# Patient Record
Sex: Male | Born: 1965 | Race: White | Hispanic: No | Marital: Married | Smoking: Never smoker
Health system: Southern US, Community
[De-identification: ages and names within clinical notes are randomized; demographics above are authoritative.]

## PROBLEM LIST (undated history)

## (undated) DIAGNOSIS — T8859XA Other complications of anesthesia, initial encounter: Secondary | ICD-10-CM

## (undated) DIAGNOSIS — G4730 Sleep apnea, unspecified: Secondary | ICD-10-CM

## (undated) DIAGNOSIS — I10 Essential (primary) hypertension: Secondary | ICD-10-CM

## (undated) DIAGNOSIS — J45909 Unspecified asthma, uncomplicated: Secondary | ICD-10-CM

## (undated) DIAGNOSIS — G709 Myoneural disorder, unspecified: Secondary | ICD-10-CM

## (undated) DIAGNOSIS — R7303 Prediabetes: Secondary | ICD-10-CM

## (undated) DIAGNOSIS — R42 Dizziness and giddiness: Secondary | ICD-10-CM

## (undated) DIAGNOSIS — C801 Malignant (primary) neoplasm, unspecified: Secondary | ICD-10-CM

## (undated) DIAGNOSIS — M1990 Unspecified osteoarthritis, unspecified site: Secondary | ICD-10-CM

## (undated) DIAGNOSIS — T7840XA Allergy, unspecified, initial encounter: Secondary | ICD-10-CM

## (undated) HISTORY — DX: Unspecified osteoarthritis, unspecified site: M19.90

## (undated) HISTORY — DX: Unspecified asthma, uncomplicated: J45.909

## (undated) HISTORY — DX: Allergy, unspecified, initial encounter: T78.40XA

## (undated) HISTORY — PX: NECK SURGERY: SHX720

## (undated) HISTORY — PX: COLONOSCOPY W/ POLYPECTOMY: SHX1380

## (undated) HISTORY — PX: SPINE SURGERY: SHX786

---

## 1998-07-01 HISTORY — PX: NECK SURGERY: SHX720

## 2002-10-16 ENCOUNTER — Encounter: Payer: Self-pay | Admitting: Emergency Medicine

## 2002-10-16 ENCOUNTER — Emergency Department (HOSPITAL_COMMUNITY): Admission: EM | Admit: 2002-10-16 | Discharge: 2002-10-16 | Payer: Self-pay | Admitting: Emergency Medicine

## 2014-01-17 ENCOUNTER — Ambulatory Visit (INDEPENDENT_AMBULATORY_CARE_PROVIDER_SITE_OTHER): Payer: BC Managed Care – PPO | Admitting: Family Medicine

## 2014-01-17 VITALS — BP 112/70 | HR 66 | Temp 98.6°F | Resp 16 | Ht 66.0 in | Wt 195.2 lb

## 2014-01-17 DIAGNOSIS — H9201 Otalgia, right ear: Secondary | ICD-10-CM

## 2014-01-17 DIAGNOSIS — H9209 Otalgia, unspecified ear: Secondary | ICD-10-CM

## 2014-01-17 DIAGNOSIS — H60399 Other infective otitis externa, unspecified ear: Secondary | ICD-10-CM

## 2014-01-17 DIAGNOSIS — R5383 Other fatigue: Secondary | ICD-10-CM

## 2014-01-17 DIAGNOSIS — H60391 Other infective otitis externa, right ear: Secondary | ICD-10-CM

## 2014-01-17 DIAGNOSIS — J309 Allergic rhinitis, unspecified: Secondary | ICD-10-CM

## 2014-01-17 DIAGNOSIS — R5381 Other malaise: Secondary | ICD-10-CM

## 2014-01-17 LAB — COMPREHENSIVE METABOLIC PANEL
ALBUMIN: 4.4 g/dL (ref 3.5–5.2)
ALK PHOS: 41 U/L (ref 39–117)
ALT: 14 U/L (ref 0–53)
AST: 15 U/L (ref 0–37)
BUN: 21 mg/dL (ref 6–23)
CALCIUM: 9.1 mg/dL (ref 8.4–10.5)
CHLORIDE: 102 meq/L (ref 96–112)
CO2: 28 mEq/L (ref 19–32)
CREATININE: 0.92 mg/dL (ref 0.50–1.35)
Glucose, Bld: 86 mg/dL (ref 70–99)
POTASSIUM: 4.1 meq/L (ref 3.5–5.3)
Sodium: 137 mEq/L (ref 135–145)
Total Bilirubin: 0.7 mg/dL (ref 0.2–1.2)
Total Protein: 6.9 g/dL (ref 6.0–8.3)

## 2014-01-17 LAB — CBC WITH DIFFERENTIAL/PLATELET
BASOS ABS: 0 10*3/uL (ref 0.0–0.1)
BASOS PCT: 0 % (ref 0–1)
Eosinophils Absolute: 0.2 10*3/uL (ref 0.0–0.7)
Eosinophils Relative: 2 % (ref 0–5)
HCT: 44.5 % (ref 39.0–52.0)
Hemoglobin: 15.4 g/dL (ref 13.0–17.0)
LYMPHS PCT: 25 % (ref 12–46)
Lymphs Abs: 1.9 10*3/uL (ref 0.7–4.0)
MCH: 30.9 pg (ref 26.0–34.0)
MCHC: 34.6 g/dL (ref 30.0–36.0)
MCV: 89.2 fL (ref 78.0–100.0)
MONO ABS: 0.8 10*3/uL (ref 0.1–1.0)
Monocytes Relative: 10 % (ref 3–12)
NEUTROS ABS: 4.9 10*3/uL (ref 1.7–7.7)
Neutrophils Relative %: 63 % (ref 43–77)
PLATELETS: 204 10*3/uL (ref 150–400)
RBC: 4.99 MIL/uL (ref 4.22–5.81)
RDW: 13.3 % (ref 11.5–15.5)
WBC: 7.7 10*3/uL (ref 4.0–10.5)

## 2014-01-17 MED ORDER — FLUTICASONE PROPIONATE 50 MCG/ACT NA SUSP: 2.0000 | Freq: Every day | NASAL | Status: DC

## 2014-01-17 MED ORDER — OFLOXACIN 0.3 % OT SOLN: 10.0000 [drp] | Freq: Every day | OTIC | Status: DC

## 2014-01-17 NOTE — Patient Instructions (Addendum)
Try to use pseudoephedrine twice a day for 3 days for ear/sinus pressure (morning and early afternoon)  Ibuprofen (advil) 2 tablets twice a day for pain    Otitis Externa Otitis externa is a bacterial or fungal infection of the outer ear canal. This is the area from the eardrum to the outside of the ear. Otitis externa is sometimes called "swimmer's ear." CAUSES  Possible causes of infection include:  Swimming in dirty water.  Moisture remaining in the ear after swimming or bathing.  Mild injury (trauma) to the ear.  Objects stuck in the ear (foreign body).  Cuts or scrapes (abrasions) on the outside of the ear. SYMPTOMS  The first symptom of infection is often itching in the ear canal. Later signs and symptoms may include swelling and redness of the ear canal, ear pain, and yellowish-white fluid (pus) coming from the ear. The ear pain may be worse when pulling on the earlobe. DIAGNOSIS  Your caregiver will perform a physical exam. A sample of fluid may be taken from the ear and examined for bacteria or fungi. TREATMENT  Antibiotic ear drops are often given for 10 to 14 days. Treatment may also include pain medicine or corticosteroids to reduce itching and swelling. PREVENTION   Keep your ear dry. Use the corner of a towel to absorb water out of the ear canal after swimming or bathing.  Avoid scratching or putting objects inside your ear. This can damage the ear canal or remove the protective wax that lines the canal. This makes it easier for bacteria and fungi to grow.  Avoid swimming in lakes, polluted water, or poorly chlorinated pools.  You may use ear drops made of rubbing alcohol and vinegar after swimming. Combine equal parts of white vinegar and alcohol in a bottle. Put 3 or 4 drops into each ear after swimming. HOME CARE INSTRUCTIONS   Apply antibiotic ear drops to the ear canal as prescribed by your caregiver.  Only take over-the-counter or prescription medicines for  pain, discomfort, or fever as directed by your caregiver.  If you have diabetes, follow any additional treatment instructions from your caregiver.  Keep all follow-up appointments as directed by your caregiver. SEEK MEDICAL CARE IF:   You have a fever.  Your ear is still red, swollen, painful, or draining pus after 3 days.  Your redness, swelling, or pain gets worse.  You have a severe headache.  You have redness, swelling, pain, or tenderness in the area behind your ear. MAKE SURE YOU:   Understand these instructions.  Will watch your condition.  Will get help right away if you are not doing well or get worse. Document Released: 06/17/2005 Document Revised: 09/09/2011 Document Reviewed: 07/04/2011 [facility] Patient Information 2015 [place], [place]. This information is not intended to replace advice given to you by your health care provider. Make sure you discuss any questions you have with your health care provider. Allergic Rhinitis Allergic rhinitis is when the mucous membranes in the nose respond to allergens. Allergens are particles in the air that cause your body to have an allergic reaction. This causes you to release allergic antibodies. Through a chain of events, these eventually cause you to release histamine into the blood stream. Although meant to protect the body, it is this release of histamine that causes your discomfort, such as frequent sneezing, congestion, and an itchy, runny nose.  CAUSES  Seasonal allergic rhinitis (hay fever) is caused by pollen allergens that may come from grasses, trees, and weeds.  Year-round allergic rhinitis (perennial allergic rhinitis) is caused by allergens such as house dust mites, pet dander, and mold spores.  SYMPTOMS   Nasal stuffiness (congestion).  Itchy, runny nose with sneezing and tearing of the eyes. DIAGNOSIS  Your health care provider can help you determine the allergen or allergens that trigger your symptoms. If you and your  health care provider are unable to determine the allergen, skin or blood testing may be used. TREATMENT  Allergic rhinitis does not have a cure, but it can be controlled by:  Medicines and allergy shots (immunotherapy).  Avoiding the allergen. Hay fever may often be treated with antihistamines in pill or nasal spray forms. Antihistamines block the effects of histamine. There are over-the-counter medicines that may help with nasal congestion and swelling around the eyes. Check with your health care provider before taking or giving this medicine.  If avoiding the allergen or the medicine prescribed do not work, there are many new medicines your health care provider can prescribe. Stronger medicine may be used if initial measures are ineffective. Desensitizing injections can be used if medicine and avoidance does not work. Desensitization is when a patient is given ongoing shots until the body becomes less sensitive to the allergen. Make sure you follow up with your health care provider if problems continue. HOME CARE INSTRUCTIONS It is not possible to completely avoid allergens, but you can reduce your symptoms by taking steps to limit your exposure to them. It helps to know exactly what you are allergic to so that you can avoid your specific triggers. SEEK MEDICAL CARE IF:   You have a fever.  You develop a cough that does not stop easily (persistent).  You have shortness of breath.  You start wheezing.  Symptoms interfere with normal daily activities. Document Released: 03/12/2001 Document Revised: 06/22/2013 Document Reviewed: 02/22/2013 [facility] Patient Information 2015 [place]. This information is not intended to replace advice given to you by your health care provider. Make sure you discuss any questions you have with your health care provider.

## 2014-01-17 NOTE — Progress Notes (Signed)
   Subjective:    Patient ID: Dale Singh, male    DOB: 1966-04-11, 48 y.o.   MRN: [id number]  HPI Patient presents today with complaints of right ear pain. Noticed fluid/pain about 6 weeks ago. Cleared out his ears with warm water and syringe and got out some cerumen. In last week has noted intermittent sharp pain in right ear, pain in right gums and pain top of right head. Has noticed this pain occasionally in last 4-5 months. Took ibuprofen 400 mg this morning about 2.5 hours ago and has improvement of pain.  Went to dentist 6 days ago and had a crown. Thinks some of his discomfort is probably related to having his mouth open for the crown.  Has daily runny nose, has tried multiple antihistamine/decongestents over the years. Can tolerate for awhile, then becomes too dry and has epistaxis.   Patient does not seek regular care, considers himself healthy and tries to take care of himself. Does not like to take medicine.  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 Constitutional: He is oriented to person, place, and time. He appears well-developed and well-nourished.  HENT:  Head: Normocephalic and atraumatic.  Right Ear: There is swelling and tenderness. A middle ear effusion is present.  Left Ear: Tympanic membrane, external ear and ear canal normal.  Nose: Mucosal edema and rhinorrhea present. Right sinus exhibits no maxillary sinus tenderness and no frontal sinus tenderness. Left sinus exhibits no maxillary sinus tenderness and no frontal sinus tenderness.  Mouth/Throat: Uvula is midline and mucous membranes are normal.  Posterior oropharyngeal erythema present. No oropharyngeal exudate or posterior oropharyngeal edema.  Eyes: Conjunctivae are normal.  Neck: Normal range of motion. Neck supple.  Cardiovascular: Normal rate and regular rhythm.   Pulmonary/Chest: Effort normal and breath sounds normal.  Musculoskeletal: Normal range of motion.  Lymphadenopathy:    He has no cervical adenopathy.  Neurological: He is alert and oriented to person, place, and time.  Skin: Skin is warm and dry.  Psychiatric: He has a normal mood and affect. His behavior is normal. Judgment and thought content normal.      Assessment & Plan:  1. Otalgia, right  2. Other fatigue - CBC with Differential - Comprehensive metabolic panel  3. Allergic rhinitis, unspecified allergic rhinitis type - fluticasone (FLONASE) 50 MCG/ACT nasal spray; Place 2 sprays into both nostrils daily.  Dispense: 16 g; Refill: 6  4. Otitis, externa, infective, right - ofloxacin (FLOXIN) 0.3 % otic solution; Place 10 drops into the right ear daily. For 7 days  Dispense: 10 mL; Refill: 0  -provided written and verbal instructions regarding otitis externa and allergic rhinitis -RTC if no improvement in 3-4 days or if worsening. -Encouraged patient to establish care and have CPE  Emi Belfasteborah B. Draysen Weygandt, FNP-BC  [facility]  01/17/2014 10:30 AM

## 2016-11-26 ENCOUNTER — Ambulatory Visit (INDEPENDENT_AMBULATORY_CARE_PROVIDER_SITE_OTHER): Payer: BC Managed Care – PPO | Admitting: Family Medicine

## 2016-11-26 ENCOUNTER — Encounter: Payer: Self-pay | Admitting: Family Medicine

## 2016-11-26 VITALS — BP 154/88 | HR 85 | Temp 98.0°F | Resp 16 | Ht >= 80 in | Wt 202.2 lb

## 2016-11-26 DIAGNOSIS — N410 Acute prostatitis: Secondary | ICD-10-CM

## 2016-11-26 DIAGNOSIS — R350 Frequency of micturition: Secondary | ICD-10-CM | POA: Diagnosis not present

## 2016-11-26 DIAGNOSIS — M545 Low back pain, unspecified: Secondary | ICD-10-CM

## 2016-11-26 DIAGNOSIS — R300 Dysuria: Secondary | ICD-10-CM | POA: Diagnosis not present

## 2016-11-26 DIAGNOSIS — R351 Nocturia: Secondary | ICD-10-CM

## 2016-11-26 DIAGNOSIS — R102 Pelvic and perineal pain: Secondary | ICD-10-CM | POA: Diagnosis not present

## 2016-11-26 LAB — POCT URINALYSIS DIP (MANUAL ENTRY)
BILIRUBIN UA: NEGATIVE mg/dL
Bilirubin, UA: NEGATIVE
Blood, UA: NEGATIVE
Glucose, UA: NEGATIVE mg/dL
LEUKOCYTES UA: NEGATIVE
NITRITE UA: NEGATIVE
PH UA: 5 (ref 5.0–8.0)
PROTEIN UA: NEGATIVE mg/dL
Spec Grav, UA: 1.01 (ref 1.010–1.025)
Urobilinogen, UA: 0.2 E.U./dL

## 2016-11-26 LAB — POCT CBC
GRANULOCYTE PERCENT: 75.7 % (ref 37–80)
HEMATOCRIT: 44 % (ref 43.5–53.7)
Hemoglobin: 15.9 g/dL (ref 14.1–18.1)
Lymph, poc: 3.5 — AB (ref 0.6–3.4)
MCH, POC: 32.5 pg — AB (ref 27–31.2)
MCHC: 36.1 g/dL — AB (ref 31.8–35.4)
MCV: 90 fL (ref 80–97)
MID (cbc): 0.5 (ref 0–0.9)
MPV: 8.8 fL (ref 0–99.8)
POC GRANULOCYTE: 12.3 — AB (ref 2–6.9)
POC LYMPH PERCENT: 21.5 %L (ref 10–50)
POC MID %: 2.8 %M (ref 0–12)
Platelet Count, POC: 231 10*3/uL (ref 142–424)
RBC: 4.88 M/uL (ref 4.69–6.13)
RDW, POC: 13.2 %
WBC: 16.2 10*3/uL — AB (ref 4.6–10.2)

## 2016-11-26 LAB — GLUCOSE, POCT (MANUAL RESULT ENTRY): POC Glucose: 89 mg/dl (ref 70–99)

## 2016-11-26 LAB — POC MICROSCOPIC URINALYSIS (UMFC): MUCUS RE: ABSENT

## 2016-11-26 MED ORDER — CIPROFLOXACIN HCL 500 MG PO TABS: 500.0000 mg | ORAL_TABLET | Freq: Two times a day (BID) | ORAL | 0 refills | Status: DC

## 2016-11-26 MED ORDER — CYCLOBENZAPRINE HCL 5 MG PO TABS: ORAL_TABLET | ORAL | 0 refills | Status: DC

## 2016-11-26 NOTE — Patient Instructions (Addendum)
Urinary symptoms may be due to prostatitis or infection of the prostate, as your infection fighting cells were elevated here today. Start Cipro 1 pill twice per day, I will let you know the results of your PSA within the next few days. If your symptoms are not improving in the next 3 days, prostate test is normal, or worsening symptoms return as we may need to obtain a CAT scan to make sure we are treating the correct infection.   See information below on back pain. Heat or ice to muscles of mid to lower back to help with muscle spasm, muscle relaxant up to every 8 hours as needed, but do not drive, operate machinery, or work on ladders while taking that medication.  Return to the clinic or go to the nearest emergency room if any of your symptoms worsen or new symptoms occur.   Prostatitis Prostatitis is swelling or inflammation of the prostate gland. The prostate is a walnut-sized gland that is involved in the production of semen. It is located below a man's bladder, in front of the rectum. There are four types of prostatitis:  Chronic nonbacterial prostatitis. This is the most common type of prostatitis. It may be associated with a viral infection or autoimmune disorder.  Acute bacterial prostatitis. This is the least common type of prostatitis. It starts quickly and is usually associated with a bladder infection, high fever, and shaking chills. It can occur at any age.  Chronic bacterial prostatitis. This type usually results from acute bacterial prostatitis that happens repeatedly (is recurrent) or has not been treated properly. It can occur in men of any age but is most common among middle-aged men whose prostate has begun to get larger. The symptoms are not as severe as symptoms caused by acute bacterial prostatitis.  Prostatodynia or chronic pelvic pain syndrome (CPPS). This type is also called pelvic floor disorder. It is associated with increased muscular tone in the pelvis surrounding  the prostate. What are the causes? Bacterial prostatitis is caused by infection from bacteria. Chronic nonbacterial prostatitis may be caused by:  Urinary tract infections (UTIs).  Nerve damage.  A response by the body's disease-fighting system (autoimmune response).  Chemicals in the urine. The causes of the other types of prostatitis are usually not known. What are the signs or symptoms? Symptoms of this condition vary depending upon the type of prostatitis. If you have acute bacterial prostatitis, you may experience:  Urinary symptoms, such as:  Painful urination.  Burning during urination.  Frequent and sudden urges to urinate.  Inability to start urinating.  A weak or interrupted stream of urine.  Vomiting.  Nausea.  Fever.  Chills.  Inability to empty the bladder completely.  Pain in the:  Muscles or joints.  Lower back.  Lower abdomen. If you have any of the other types of prostatitis, you may experience:  Urinary symptoms, such as:  Sudden urges to urinate.  Frequent urination.  Difficulty starting urination.  Weak urine stream.  Dribbling after urination.  Discharge from the urethra. The urethra is a tube that opens at the end of the penis.  Pain in the:  Testicles.  Penis or tip of the penis.  Rectum.  Area in front of the rectum and below the scrotum (perineum).  Problems with sexual function.  Painful ejaculation.  Bloody semen. How is this diagnosed? This condition may be diagnosed based on:  A physical and medical exam.  Your symptoms.  A urine test to check for bacteria.  An exam in which a health care provider uses a finger to feel the prostate (digital rectal exam).  A test of a sample of semen.  Blood tests.  Ultrasound.  Removal of prostate tissue to be examined under a microscope (biopsy).  Tests to check how your body handles urine (urodynamic tests).  A test to look inside your bladder or urethra  (cystoscopy). How is this treated? Treatment for this condition depends on the type of prostatitis. Treatment may involve:  Medicines to relieve pain or inflammation.  Medicines to help relax your muscles.  Physical therapy.  Heat therapy.  Techniques to help you control certain body functions (biofeedback).  Relaxation exercises.  Antibiotic medicine, if your condition is caused by bacteria.  Warm water baths (sitz baths). Sitz baths help with relaxing your pelvic floor muscles, which helps to relieve pressure on the prostate. Follow these instructions at home:  Take over-the-counter and prescription medicines only as told by your health care provider.  If you were prescribed an antibiotic, take it as told by your health care provider. Do not stop taking the antibiotic even if you start to feel better.  If physical therapy, biofeedback, or relaxation exercises were prescribed, do exercises as instructed.  Take sitz baths as directed by your health care provider. For a sitz bath, sit in warm water that is deep enough to cover your hips and buttocks.  Keep all follow-up visits as told by your health care provider. This is important. Contact a health care provider if:  Your symptoms get worse.  You have a fever. Get help right away if:  You have chills.  You feel nauseous.  You vomit.  You feel light-headed or feel like you are going to faint.  You are unable to urinate.  You have blood or blood clots in your urine. This information is not intended to replace advice given to you by your health care provider. Make sure you discuss any questions you have with your health care provider. Document Released: 06/14/2000 Document Revised: 03/07/2016 Document Reviewed: 03/07/2016 Elsevier Interactive Patient Education  2017 [employer]    Back Pain, Adult Back pain is very common in adults.The cause of back pain is rarely dangerous and the pain often gets better over  time.The cause of your back pain may not be known. Some common causes of back pain include:  Strain of the muscles or ligaments supporting the spine.  Wear and tear (degeneration) of the spinal disks.  Arthritis.  Direct injury to the back. For many people, back pain may return. Since back pain is rarely dangerous, most people can learn to manage this condition on their own. Follow these instructions at home: Watch your back pain for any changes. The following actions may help to lessen any discomfort you are feeling:  Remain active. It is stressful on your back to sit or stand in one place for long periods of time. Do not sit, drive, or stand in one place for more than 30 minutes at a time. Take short walks on even surfaces as soon as you are able.Try to increase the length of time you walk each day.  Exercise regularly as directed by your health care provider. Exercise helps your back heal faster. It also helps avoid future injury by keeping your muscles strong and flexible.  Do not stay in bed.Resting more than 1-2 days can delay your recovery.  Pay attention to your body when you bend and lift. The most comfortable positions are  those that put less stress on your recovering back. Always use proper lifting techniques, including:  Bending your knees.  Keeping the load close to your body.  Avoiding twisting.  Find a comfortable position to sleep. Use a firm mattress and lie on your side with your knees slightly bent. If you lie on your back, put a pillow under your knees.  Avoid feeling anxious or stressed.Stress increases muscle tension and can worsen back pain.It is important to recognize when you are anxious or stressed and learn ways to manage it, such as with exercise.  Take medicines only as directed by your health care provider. Over-the-counter medicines to reduce pain and inflammation are often the most helpful.Your health care provider may prescribe muscle relaxant  drugs.These medicines help dull your pain so you can more quickly return to your normal activities and healthy exercise.  Apply ice to the injured area:  Put ice in a plastic bag.  Place a towel between your skin and the bag.  Leave the ice on for 20 minutes, 2-3 times a day for the first 2-3 days. After that, ice and heat may be alternated to reduce pain and spasms.  Maintain a healthy weight. Excess weight puts extra stress on your back and makes it difficult to maintain good posture. Contact a health care provider if:  You have pain that is not relieved with rest or medicine.  You have increasing pain going down into the legs or buttocks.  You have pain that does not improve in one week.  You have night pain.  You lose weight.  You have a fever or chills. Get help right away if:  You develop new bowel or bladder control problems.  You have unusual weakness or numbness in your arms or legs.  You develop nausea or vomiting.  You develop abdominal pain.  You feel faint. This information is not intended to replace advice given to you by your health care provider. Make sure you discuss any questions you have with your health care provider. Document Released: 06/17/2005 Document Revised: 10/26/2015 Document Reviewed: 10/19/2013 Elsevier Interactive Patient Education  2017 [employer]   IF you received an x-ray today, you will receive an invoice from [facility]. Please contact [facility] at [phone] with questions or concerns regarding your invoice.   IF you received labwork today, you will receive an invoice from [facility]. Please contact [facility] at [phone] with questions or concerns regarding your invoice.   Our billing staff will not be able to assist you with questions regarding bills from these companies.  You will be contacted with the lab results as soon as they are available. The fastest way to get your results is to activate your My  Chart account. Instructions are located on the last page of this paperwork. If you have not heard from us regarding the results in 2 weeks, please contact this office.

## 2016-11-26 NOTE — Progress Notes (Signed)
By signing my name below, I, Mesha Guinyard, attest that this documentation has been prepared under the direction and in the presence of Meredith Staggers, MD.  Electronically Signed: Arvilla Market, Medical Scribe. 11/26/16. 5:41 PM.  Subjective:    Patient ID: Dale Singh, male    DOB: 04-18-1966, 51 y.o.   MRN: [id number]  HPI Chief Complaint  Patient presents with  . Dysuria    HPI Comments: Dale Singh is a 51 y.o. male who presents to Primary Care at [facility] complaining of worsening dysuria onset . Reports associated sxs of intermittent urinary urgency, intermittent urinary frequency, nocturia 1-2x a night for the past 2 weeks, and discomfort at the beginning of his stream. Last night pt had 4 episodes of nocturia. Describes his urinary frequency as some days going every 15 mins and other times he could go every 2 hours. Pt reports his urinary sxs started worsening after taking steroids for a back injury 3 weeks ago. Denies fever, N/V, melena, night sweats, weight loss, bowel/bladder incontinence, and blood in stool. Has not had colonoscopy.   Back Pain: Describes pain as tightness felt from midback, down. Pt went to the ER in [place]- x-rays were nl, but the tendons between the SI were swollen. He was rx'ed steroids and ibuprofen for relief of his back pain. Pt was taking 800 mg ibuprofen every 8 hours but he has stopped 4 days after taking it due to abdominal pain.  There are no active problems to display for this patient.  Past Medical History:  Diagnosis Date  . Allergy   . Arthritis   . Asthma    Past Surgical History:  Procedure Laterality Date  . SPINE SURGERY     No Known Allergies Prior to Admission medications   Medication Sig Start Date End Date Taking? Authorizing Provider  predniSONE (DELTASONE) 20 MG tablet Take 20 mg by mouth daily with breakfast.   Yes [provider]  fluticasone (FLONASE) 50 MCG/ACT nasal spray Place 2 sprays into both  nostrils daily. Patient not taking: Reported on 11/26/2016 01/17/14   Emi Belfast, FNP  ofloxacin (FLOXIN) 0.3 % otic solution Place 10 drops into the right ear daily. For 7 days Patient not taking: Reported on 11/26/2016 01/17/14   Emi Belfast, FNP   Social History   Social History  . Marital status: Single    Spouse name: N/A  . Number of children: N/A  . Years of education: N/A   Occupational History  . Not on file.   Social History Main Topics  . Smoking status: Former Games developer  . Smokeless tobacco: Never Used  . Alcohol use Yes  . Drug use: No  . Sexual activity: Not on file   Other Topics Concern  . Not on file   Social History Narrative  . No narrative on file   Review of Systems  Constitutional: Negative for diaphoresis, fever and unexpected weight change.  Gastrointestinal: Negative for blood in stool, nausea and vomiting.  Genitourinary: Positive for dysuria, frequency and urgency.  Musculoskeletal: Positive for back pain.   Objective:  Physical Exam  Constitutional: He appears well-developed and well-nourished. No distress.  HENT:  Head: Normocephalic and atraumatic.  Eyes: Conjunctivae are normal.  Neck: Neck supple.  Cardiovascular: Normal rate, regular rhythm and normal heart sounds.  Exam reveals no gallop and no friction rub.   No murmur heard. Pulmonary/Chest: Effort normal and breath sounds normal. No respiratory distress. He has no wheezes. He has no  rales.  Abdominal: Soft. Bowel sounds are normal.  Minimal discomfort suprapubic  Genitourinary:  Genitourinary Comments: Minimal discomfort with exam No palpable nodules Limited on the anterior exam  Musculoskeletal:  Back exam: Equal ROM  Neurological: He is alert.  Skin: Skin is warm and dry.  Psychiatric: He has a normal mood and affect. His behavior is normal.  Nursing note and vitals reviewed.   Vitals:   11/26/16 1651  BP: (!) 154/88  Pulse: 85  Resp: 16  Temp: 98 F (36.7  C)  TempSrc: Oral  SpO2: 100%  Weight: 202 lb 3.2 oz (91.7 kg)  Height: (!) 8\' 10"  (2.692 m)   Body mass index is 12.65 kg/m.   Results for orders placed or performed in visit on 11/26/16  POCT urinalysis dipstick  Result Value Ref Range   Color, UA yellow yellow   Clarity, UA clear clear   Glucose, UA negative negative mg/dL   Bilirubin, UA negative negative   Ketones, POC UA negative negative mg/dL   Spec Grav, UA 6.045 4.098 - 1.025   Blood, UA negative negative   pH, UA 5.0 5.0 - 8.0   Protein Ur, POC negative negative mg/dL   Urobilinogen, UA 0.2 0.2 or 1.0 E.U./dL   Nitrite, UA Negative Negative   Leukocytes, UA Negative Negative  POCT Microscopic Urinalysis ([facility])  Result Value Ref Range   WBC,UR,HPF,POC None None WBC/hpf   RBC,UR,HPF,POC None None RBC/hpf   Bacteria None None, Too numerous to count   Mucus Absent Absent   Epithelial Cells, UR Per Microscopy Few (A) None, Too numerous to count cells/hpf  POCT glucose (manual entry)  Result Value Ref Range   POC Glucose 89 70 - 99 mg/dl  POCT CBC  Result Value Ref Range   WBC 16.2 (A) 4.6 - 10.2 K/uL   Lymph, poc 3.5 (A) 0.6 - 3.4   POC LYMPH PERCENT 21.5 10 - 50 %L   MID (cbc) 0.5 0 - 0.9   POC MID % 2.8 0 - 12 %M   POC Granulocyte 12.3 (A) 2 - 6.9   Granulocyte percent 75.7 37 - 80 %G   RBC 4.88 4.69 - 6.13 M/uL   Hemoglobin 15.9 14.1 - 18.1 g/dL   HCT, POC 11.9 14.7 - 53.7 %   MCV 90.0 80 - 97 fL   MCH, POC 32.5 (A) 27 - 31.2 pg   MCHC 36.1 (A) 31.8 - 35.4 g/dL   RDW, POC 82.9 %   Platelet Count, POC 231 142 - 424 K/uL   MPV 8.8 0 - 99.8 fL   Assessment & Plan:    Dale Singh is a 51 y.o. male Dysuria - Plan: POCT urinalysis dipstick, POCT Microscopic Urinalysis ([facility]), POCT CBC, ciprofloxacin (CIPRO) 500 MG tablet Nocturia - Plan: POCT glucose (manual entry) Urinary frequency - Plan: PSA Suprapubic abdominal pain - Plan: POCT CBC, ciprofloxacin (CIPRO) 500 MG tablet Acute prostatitis - Plan:  POCT CBC, ciprofloxacin (CIPRO) 500 MG tablet  - Based on urinary symptoms, suspicious for acute prostatitis. Less likely diverticulitis. Elevated WBC may be due to infection as well as recent prednisone. Afebrile in office.  - Check PSA, start Cipro 500 mg twice a day, tendinopathy risks and precautions were discussed.  - RTC precautions if not improving within the next 3 days. Sooner if worse. Consider abdominal CT if persistent or worsening symptoms.  Acute left-sided low back pain without sciatica - Plan: cyclobenzaprine (FLEXERIL) 5 MG tablet  - Lower back pain/reported  sacroiliitis symptoms have improved. Can taper off prednisone. Handout given on low back pain, Flexeril if needed for episodic spasm of paraspinals. Side effects and precautions were discussed. RTC precautions if persistent discomfort.     Meds ordered this encounter  Medications  . predniSONE (DELTASONE) 20 MG tablet    Sig: Take 20 mg by mouth daily with breakfast.  . cyclobenzaprine (FLEXERIL) 5 MG tablet    Sig: 1 pill by mouth up to every 8 hours as needed. Start with one pill by mouth each bedtime as needed due to sedation    Dispense:  15 tablet    Refill:  0  . ciprofloxacin (CIPRO) 500 MG tablet    Sig: Take 1 tablet (500 mg total) by mouth 2 (two) times daily.    Dispense:  20 tablet    Refill:  0   Patient Instructions    Urinary symptoms may be due to prostatitis or infection of the prostate, as your infection fighting cells were elevated here today. Start Cipro 1 pill twice per day, I will let you know the results of your PSA within the next few days. If your symptoms are not improving in the next 3 days, prostate test is normal, or worsening symptoms return as we may need to obtain a CAT scan to make sure we are treating the correct infection.   See information below on back pain. Heat or ice to muscles of mid to lower back to help with muscle spasm, muscle relaxant up to every 8 hours as needed, but do  not drive, operate machinery, or work on ladders while taking that medication.  Return to the clinic or go to the nearest emergency room if any of your symptoms worsen or new symptoms occur.   Prostatitis Prostatitis is swelling or inflammation of the prostate gland. The prostate is a walnut-sized gland that is involved in the production of semen. It is located below a man's bladder, in front of the rectum. There are four types of prostatitis:  Chronic nonbacterial prostatitis. This is the most common type of prostatitis. It may be associated with a viral infection or autoimmune disorder.  Acute bacterial prostatitis. This is the least common type of prostatitis. It starts quickly and is usually associated with a bladder infection, high fever, and shaking chills. It can occur at any age.  Chronic bacterial prostatitis. This type usually results from acute bacterial prostatitis that happens repeatedly (is recurrent) or has not been treated properly. It can occur in men of any age but is most common among middle-aged men whose prostate has begun to get larger. The symptoms are not as severe as symptoms caused by acute bacterial prostatitis.  Prostatodynia or chronic pelvic pain syndrome (CPPS). This type is also called pelvic floor disorder. It is associated with increased muscular tone in the pelvis surrounding the prostate. What are the causes? Bacterial prostatitis is caused by infection from bacteria. Chronic nonbacterial prostatitis may be caused by:  Urinary tract infections (UTIs).  Nerve damage.  A response by the body's disease-fighting system (autoimmune response).  Chemicals in the urine. The causes of the other types of prostatitis are usually not known. What are the signs or symptoms? Symptoms of this condition vary depending upon the type of prostatitis. If you have acute bacterial prostatitis, you may experience:  Urinary symptoms, such as:  Painful urination.  Burning  during urination.  Frequent and sudden urges to urinate.  Inability to start urinating.  A weak or interrupted  stream of urine.  Vomiting.  Nausea.  Fever.  Chills.  Inability to empty the bladder completely.  Pain in the:  Muscles or joints.  Lower back.  Lower abdomen. If you have any of the other types of prostatitis, you may experience:  Urinary symptoms, such as:  Sudden urges to urinate.  Frequent urination.  Difficulty starting urination.  Weak urine stream.  Dribbling after urination.  Discharge from the urethra. The urethra is a tube that opens at the end of the penis.  Pain in the:  Testicles.  Penis or tip of the penis.  Rectum.  Area in front of the rectum and below the scrotum (perineum).  Problems with sexual function.  Painful ejaculation.  Bloody semen. How is this diagnosed? This condition may be diagnosed based on:  A physical and medical exam.  Your symptoms.  A urine test to check for bacteria.  An exam in which a health care provider uses a finger to feel the prostate (digital rectal exam).  A test of a sample of semen.  Blood tests.  Ultrasound.  Removal of prostate tissue to be examined under a microscope (biopsy).  Tests to check how your body handles urine (urodynamic tests).  A test to look inside your bladder or urethra (cystoscopy). How is this treated? Treatment for this condition depends on the type of prostatitis. Treatment may involve:  Medicines to relieve pain or inflammation.  Medicines to help relax your muscles.  Physical therapy.  Heat therapy.  Techniques to help you control certain body functions (biofeedback).  Relaxation exercises.  Antibiotic medicine, if your condition is caused by bacteria.  Warm water baths (sitz baths). Sitz baths help with relaxing your pelvic floor muscles, which helps to relieve pressure on the prostate. Follow these instructions at home:  Take  over-the-counter and prescription medicines only as told by your health care provider.  If you were prescribed an antibiotic, take it as told by your health care provider. Do not stop taking the antibiotic even if you start to feel better.  If physical therapy, biofeedback, or relaxation exercises were prescribed, do exercises as instructed.  Take sitz baths as directed by your health care provider. For a sitz bath, sit in warm water that is deep enough to cover your hips and buttocks.  Keep all follow-up visits as told by your health care provider. This is important. Contact a health care provider if:  Your symptoms get worse.  You have a fever. Get help right away if:  You have chills.  You feel nauseous.  You vomit.  You feel light-headed or feel like you are going to faint.  You are unable to urinate.  You have blood or blood clots in your urine. This information is not intended to replace advice given to you by your health care provider. Make sure you discuss any questions you have with your health care provider. Document Released: 06/14/2000 Document Revised: 03/07/2016 Document Reviewed: 03/07/2016 Elsevier Interactive Patient Education  2017 [employer]    Back Pain, Adult Back pain is very common in adults.The cause of back pain is rarely dangerous and the pain often gets better over time.The cause of your back pain may not be known. Some common causes of back pain include:  Strain of the muscles or ligaments supporting the spine.  Wear and tear (degeneration) of the spinal disks.  Arthritis.  Direct injury to the back. For many people, back pain may return. Since back pain is rarely dangerous,  most people can learn to manage this condition on their own. Follow these instructions at home: Watch your back pain for any changes. The following actions may help to lessen any discomfort you are feeling:  Remain active. It is stressful on your back to sit or  stand in one place for long periods of time. Do not sit, drive, or stand in one place for more than 30 minutes at a time. Take short walks on even surfaces as soon as you are able.Try to increase the length of time you walk each day.  Exercise regularly as directed by your health care provider. Exercise helps your back heal faster. It also helps avoid future injury by keeping your muscles strong and flexible.  Do not stay in bed.Resting more than 1-2 days can delay your recovery.  Pay attention to your body when you bend and lift. The most comfortable positions are those that put less stress on your recovering back. Always use proper lifting techniques, including:  Bending your knees.  Keeping the load close to your body.  Avoiding twisting.  Find a comfortable position to sleep. Use a firm mattress and lie on your side with your knees slightly bent. If you lie on your back, put a pillow under your knees.  Avoid feeling anxious or stressed.Stress increases muscle tension and can worsen back pain.It is important to recognize when you are anxious or stressed and learn ways to manage it, such as with exercise.  Take medicines only as directed by your health care provider. Over-the-counter medicines to reduce pain and inflammation are often the most helpful.Your health care provider may prescribe muscle relaxant drugs.These medicines help dull your pain so you can more quickly return to your normal activities and healthy exercise.  Apply ice to the injured area:  Put ice in a plastic bag.  Place a towel between your skin and the bag.  Leave the ice on for 20 minutes, 2-3 times a day for the first 2-3 days. After that, ice and heat may be alternated to reduce pain and spasms.  Maintain a healthy weight. Excess weight puts extra stress on your back and makes it difficult to maintain good posture. Contact a health care provider if:  You have pain that is not relieved with rest or  medicine.  You have increasing pain going down into the legs or buttocks.  You have pain that does not improve in one week.  You have night pain.  You lose weight.  You have a fever or chills. Get help right away if:  You develop new bowel or bladder control problems.  You have unusual weakness or numbness in your arms or legs.  You develop nausea or vomiting.  You develop abdominal pain.  You feel faint. This information is not intended to replace advice given to you by your health care provider. Make sure you discuss any questions you have with your health care provider. Document Released: 06/17/2005 Document Revised: 10/26/2015 Document Reviewed: 10/19/2013 Elsevier Interactive Patient Education  2017 [employer].   IF you received an x-ray today, you will receive an invoice from [facility]. Please contact [facility] at [phone] with questions or concerns regarding your invoice.   IF you received labwork today, you will receive an invoice from [facility]. Please contact [facility] at [phone] with questions or concerns regarding your invoice.   Our billing staff will not be able to assist you with questions regarding bills from these companies.  You will be contacted  with the lab results as soon as they are available. The fastest way to get your results is to activate your My Chart account. Instructions are located on the last page of this paperwork. If you have not heard from us regarding the results in 2 weeks, please contact this office.       I personally performed the services described in this documentation, which was scribed in my presence. The recorded information has been reviewed and considered for accuracy and completeness, addended by me as needed, and agree with information above.  Signed,   Meredith StaggersJeffrey Daisia Slomski, MD [facility].  11/26/16 6:41 PM

## 2016-11-27 LAB — PSA: Prostate Specific Ag, Serum: 2.4 ng/mL (ref 0.0–4.0)

## 2017-06-04 ENCOUNTER — Encounter: Payer: Self-pay | Admitting: Family Medicine

## 2017-06-04 ENCOUNTER — Ambulatory Visit: Payer: BC Managed Care – PPO | Admitting: Family Medicine

## 2017-06-04 VITALS — BP 138/80 | HR 88 | Ht 66.0 in | Wt 197.7 lb

## 2017-06-04 DIAGNOSIS — Z0000 Encounter for general adult medical examination without abnormal findings: Secondary | ICD-10-CM

## 2017-06-04 DIAGNOSIS — J3089 Other allergic rhinitis: Secondary | ICD-10-CM

## 2017-06-04 DIAGNOSIS — M1990 Unspecified osteoarthritis, unspecified site: Secondary | ICD-10-CM

## 2017-06-04 DIAGNOSIS — E669 Obesity, unspecified: Secondary | ICD-10-CM

## 2017-06-04 NOTE — Patient Instructions (Addendum)
Please ask Dale Singh if your medical insurance covers a yearly wellness examination including lab work.    If they do then in the near future plan on coming in for just fasting blood work at your convenience then make a follow-up office visit to discuss the results with me in the near future    Please realize, EXERCISE IS MEDICINE!  -  American Heart Association [facility]( AHA) guidelines for exercise : If you are in good health, without any medical conditions, you should engage in 150 minutes of moderate intensity aerobic activity per week.  This means you should be huffing and puffing throughout your workout.   Engaging in regular exercise will improve brain function and memory, as well as improve mood, boost immune system and help with weight management.  As well as the other, more well-known effects of exercise such as decreasing blood sugar levels, decreasing blood pressure,  and decreasing bad cholesterol levels/ increasing good cholesterol levels.     -  The AHA strongly endorses consumption of a diet that contains a variety of foods from all the food categories with an emphasis on fruits and vegetables; fat-free and low-fat dairy products; cereal and grain products; legumes and nuts; and fish, poultry, and/or extra lean meats.    Excessive food intake, especially of foods high in saturated and trans fats, sugar, and salt, should be avoided.    Adequate water intake of roughly 1/2 of your weight in pounds, should equal the ounces of water per day you should drink.  So for instance, if you're 200 pounds, that would be 100 ounces of water per day.         Mediterranean Diet  Why follow it? Research shows. . Those who follow the Mediterranean diet have a reduced risk of heart disease  . The diet is associated with a reduced incidence of Parkinson's and Alzheimer's diseases . People following the diet may have longer life expectancies and lower rates of chronic diseases  . The Dietary Guidelines for  Americans recommends the Mediterranean diet as an eating plan to promote health and prevent disease  What Is the Mediterranean Diet?  . Healthy eating plan based on typical foods and recipes of Mediterranean-style cooking . The diet is primarily a plant based diet; these foods should make up a majority of meals   Starches - Plant based foods should make up a majority of meals - They are an important sources of vitamins, minerals, energy, antioxidants, and fiber - Choose whole grains, foods high in fiber and minimally processed items  - Typical grain sources include wheat, oats, barley, corn, brown rice, bulgar, farro, millet, polenta, couscous  - Various types of beans include chickpeas, lentils, fava beans, black beans, white beans   Fruits  Veggies - Large quantities of antioxidant rich fruits & veggies; 6 or more servings  - Vegetables can be eaten raw or lightly drizzled with oil and cooked  - Vegetables common to the traditional Mediterranean Diet include: artichokes, arugula, beets, broccoli, brussel sprouts, cabbage, carrots, celery, collard greens, cucumbers, eggplant, kale, leeks, lemons, lettuce, mushrooms, okra, onions, peas, peppers, potatoes, pumpkin, radishes, rutabaga, shallots, spinach, sweet potatoes, turnips, zucchini - Fruits common to the Mediterranean Diet include: apples, apricots, avocados, cherries, clementines, dates, figs, grapefruits, grapes, melons, nectarines, oranges, peaches, pears, pomegranates, strawberries, tangerines  Fats - Replace butter and margarine with healthy oils, such as olive oil, canola oil, and tahini  - Limit nuts to no more than a handful a day  -  Nuts include walnuts, almonds, pecans, pistachios, pine nuts  - Limit or avoid candied, honey roasted or heavily salted nuts - Olives are central to the Mediterranean diet - can be eaten whole or used in a variety of dishes   Meats Protein - Limiting red meat: no more than a few times a month - When  eating red meat: choose lean cuts and keep the portion to the size of deck of cards - Eggs: approx. 0 to 4 times a week  - Fish and lean poultry: at least 2 a week  - Healthy protein sources include, chicken, [place], lean beef, lamb - Increase intake of seafood such as tuna, salmon, trout, mackerel, shrimp, scallops - Avoid or limit high fat processed meats such as sausage and bacon  Dairy - Include moderate amounts of low fat dairy products  - Focus on healthy dairy such as fat free yogurt, skim milk, low or reduced fat cheese - Limit dairy products higher in fat such as whole or 2% milk, cheese, ice cream  Alcohol - Moderate amounts of red wine is ok  - No more than 5 oz daily for women (all ages) and men older than age 66  - No more than 10 oz of wine daily for men younger than 34  Other - Limit sweets and other desserts  - Use herbs and spices instead of salt to flavor foods  - Herbs and spices common to the traditional Mediterranean Diet include: basil, bay leaves, chives, cloves, cumin, fennel, garlic, lavender, marjoram, mint, oregano, parsley, pepper, rosemary, sage, savory, sumac, tarragon, thyme   It's not just a diet, it's a lifestyle:  . The Mediterranean diet includes lifestyle factors typical of those in the region  . Foods, drinks and meals are best eaten with others and savored . Daily physical activity is important for overall good health . This could be strenuous exercise like running and aerobics . This could also be more leisurely activities such as walking, housework, yard-work, or taking the stairs . Moderation is the key; a balanced and healthy diet accommodates most foods and drinks . Consider portion sizes and frequency of consumption of certain foods   Meal Ideas & Options:  . Breakfast:  o Whole wheat toast or whole wheat English muffins with peanut butter & hard boiled egg o Steel cut oats topped with apples & cinnamon and skim milk  o Fresh fruit: banana,  strawberries, melon, berries, peaches  o Smoothies: strawberries, bananas, greek yogurt, peanut butter o Low fat greek yogurt with blueberries and granola  o Egg white omelet with spinach and mushrooms o Breakfast couscous: whole wheat couscous, apricots, skim milk, cranberries  . Sandwiches:  o Hummus and grilled vegetables (peppers, zucchini, squash) on whole wheat bread   o Grilled chicken on whole wheat pita with lettuce, tomatoes, cucumbers or tzatziki  o Tuna salad on whole wheat bread: tuna salad made with greek yogurt, olives, red peppers, capers, green onions o Garlic rosemary lamb pita: lamb sauted with garlic, rosemary, salt & pepper; add lettuce, cucumber, greek yogurt to pita - flavor with lemon juice and black pepper  . Seafood:  o Mediterranean grilled salmon, seasoned with garlic, basil, parsley, lemon juice and black pepper o Shrimp, lemon, and spinach whole-grain pasta salad made with low fat greek yogurt  o Seared scallops with lemon orzo  o Seared tuna steaks seasoned salt, pepper, coriander topped with tomato mixture of olives, tomatoes, olive oil, minced garlic, parsley, green onions and  cappers  . Meats:  o Herbed greek chicken salad with kalamata olives, cucumber, feta  o Red bell peppers stuffed with spinach, bulgur, lean ground beef (or lentils) & topped with feta   o Kebabs: skewers of chicken, tomatoes, onions, zucchini, squash  o [place]key burgers: made with red onions, mint, dill, lemon juice, feta cheese topped with roasted red peppers . Vegetarian o Cucumber salad: cucumbers, artichoke hearts, celery, red onion, feta cheese, tossed in olive oil & lemon juice  o Hummus and whole grain pita points with a greek salad (lettuce, tomato, feta, olives, cucumbers, red onion) o Lentil soup with celery, carrots made with vegetable broth, garlic, salt and pepper  o Tabouli salad: parsley, bulgur, mint, scallions, cucumbers, tomato, radishes, lemon juice, olive oil, salt and  pepper.     How to Increase Your Level of Physical Activity  Getting regular physical activity is important for your overall health and well-being. Most people do not get enough exercise. There are easy ways to increase your level of physical activity, even if you have not been very active in the past or you are just starting out. Why is physical activity important? Physical activity has many short-term and long-term health benefits. Regular exercise can:  Help you lose weight or maintain a healthy weight.  Strengthen your muscles and bones.  Boost your mood and improve self-esteem.  Reduce your risk of certain long-term (chronic) diseases, like heart disease, cancer, and diabetes.  Help you stay capable of walking and moving around (mobile) as you age.  Prevent accidents, such as falls, as you age.  Increase life expectancy.  What are the benefits of being physically active on a regular basis? In addition to improving your physical health, being physically active on most days of the week can help you in ways that you may not expect. Benefits of regular physical activity may include:  Feeling good about your body.  Being able to move around more easily and for longer periods of time without getting tired (increased stamina).  Finding new sources of fun and enjoyment.  Meeting new people who share a common interest.  Being able to fight off illness better (enhanced immunity).  Being able to sleep better.  What can happen if I am not physically active on a regular basis? Not getting enough physical activity can lead to an unhealthy lifestyle and future health problems. This can increase your chances of:  Becoming overweight or obese.  Becoming sick.  Developing chronic illnesses, like heart disease or diabetes.  Having mental health problems, like depression or anxiety.  Having sleep problems.  Having trouble walking or getting yourself around (reduced  mobility).  Injuring yourself in a fall as you get older.  What steps can I take to be more physically active?  Check with your health care provider about how to get started. Ask your health care provider what activities are safe for you.  Start out slowly. Walking or doing some simple chair exercises is a good place to start, especially if you have not been active before or for a long time.  Try to find activities that you enjoy. You are more likely to commit to an exercise routine if it does not feel like a chore.  If you have bone or joint problems, choose low-impact exercises, like walking or swimming.  Include physical activity in your everyday routine.  Invite friends or family members to exercise with you. This also will help you commit to your workout plan.  Set goals that you can work toward.  Aim for at least 150 minutes of moderate-intensity exercise each week. Examples of moderate-intensity exercise include walking or riding a bike. Where to find more information:  Centers for Disease Control and Prevention: JokeRule.co.uk  President's Council on [employer] Nutrition www.http://smith-thompson.com/  ChooseMyPlate: MissedFlights.com.br Contact a health care provider if:  You have headaches, muscle aches, or joint pain.  You feel dizzy or light-headed while exercising.  You faint.  You have chest pain while exercising. Summary  Exercise benefits your mind and body at any age, even if you are just starting out.  If you have a chronic illness or have not been active for a while, check with your health care provider before increasing your physical activity.  Choose activities that are safe and enjoyable for you.Ask your health care provider what activities are safe for you.  Start slowly. Tell your health care provider if you have problems as you start to increase your activity level. This information is not  intended to replace advice given to you by your health care provider. Make sure you discuss any questions you have with your health care provider. Document Released: 06/06/2016 Document Revised: 06/06/2016 Document Reviewed: 06/06/2016 Elsevier Interactive Patient Education  [employer].

## 2017-06-04 NOTE — Progress Notes (Signed)
New patient office visit note:  Impression and Recommendations:    1. Class 1 obesity in adult, unspecified BMI, unspecified obesity type, unspecified whether serious comorbidity present   2. Health maintenance examination   3. Osteoarthritis, unspecified osteoarthritis type, unspecified site   4. Environmental and seasonal allergies     Education and routine counseling performed. Handouts provided.  Gross side effects, risk and benefits, and alternatives of medications discussed with patient.  Patient is aware that all medications have potential side effects and we are unable to predict every side effect or drug-drug interaction that may occur.  Expresses verbal understanding and consents to current therapy plan and treatment regimen.  Return for CPE/ yrly physical, come fasting.  Please see AVS handed out to patient at the end of our visit for further patient instructions/ counseling done pertaining to today's office visit.    Note: This document was prepared using Dragon voice recognition software and may include unintentional dictation errors.  ----------------------------------------------------------------------------------------------------------------------    Subjective:    Chief complaint:   Chief Complaint  Patient presents with  . Establish Care     HPI: Dale Singh is a pleasant 51 y.o. male who [facility] to review their medicCarney Cornersal history with me and establish care.   I asked the patient to review their chronic problem list with me to ensure everything was updated and accurate.    All recent office visits with other providers, any medical records that patient brought in etc  - I reviewed today.     Also asked pt to get me medical records from [facility] providers/ specialists that they had seen within the past 3-5 years- if they are in private practice and/or do not work for a [facility], [facility], [facility], [facility]  or [facility] owned practice.  Told them to call their specialists to clarify this if they are not sure.    never seen doc in over 20 yrs or more for regular care other than UC if sick acutely.   Did see [facility] in past in [facility] Pt-   Neck pain- occ,   Problem  Class 1 Obesity in Adult  Environmental and Seasonal Allergies  Osteoarthritis      Wt Readings from Last 3 Encounters:  07/08/17 200 lb (90.7 kg)  06/04/17 197 lb 11.2 oz (89.7 kg)  11/26/16 202 lb 3.2 oz (91.7 kg)   BP Readings from Last 3 Encounters:  07/08/17 129/84  06/04/17 138/80  11/26/16 (!) 154/88   Pulse Readings from Last 3 Encounters:  07/08/17 86  06/04/17 88  11/26/16 85   BMI Readings from Last 3 Encounters:  07/08/17 32.28 kg/m  06/04/17 31.91 kg/m  11/26/16 12.65 kg/m    Patient Care Team    Relationship Specialty Notifications Start End  Thomasene Lotpalski, Aaronmichael Brumbaugh, DO PCP - General Family Medicine  06/04/17     Patient Active Problem List   Diagnosis Date Noted  . Class 1 obesity in adult 07/08/2017  . Environmental and seasonal allergies 07/08/2017  . Osteoarthritis 07/08/2017     Past Medical History:  Diagnosis Date  . Allergy   . Arthritis   . Asthma      Past Medical History:  Diagnosis Date  . Allergy   . Arthritis   . Asthma      Past Surgical History:  Procedure Laterality Date  . NECK SURGERY    . SPINE SURGERY  Family History  Problem Relation Age of Onset  . Diabetes Mother   . Diabetes Father      Social History   Substance and Sexual Activity  Drug Use No     Social History   Substance and Sexual Activity  Alcohol Use Yes     Social History   Tobacco Use  Smoking Status Former Smoker  Smokeless Tobacco Never Used     Outpatient Encounter Medications as of 06/04/2017  Medication Sig  . [DISCONTINUED] ciprofloxacin (CIPRO) 500 MG tablet Take 1 tablet (500 mg total) by mouth 2 (two) times daily.  . [DISCONTINUED] cyclobenzaprine  (FLEXERIL) 5 MG tablet 1 pill by mouth up to every 8 hours as needed. Start with one pill by mouth each bedtime as needed due to sedation  . [DISCONTINUED] predniSONE (DELTASONE) 20 MG tablet Take 20 mg by mouth daily with breakfast.   No facility-administered encounter medications on file as of 06/04/2017.     Allergies: Patient has no known allergies.   ROS   Objective:   Blood pressure 138/80, pulse 88, height 5\' 6"  (1.676 m), weight 197 lb 11.2 oz (89.7 kg). Body mass index is 31.91 kg/m. General: Well Developed, well nourished, and in no acute distress.  Neuro: Alert and oriented x3, extra-ocular muscles intact, sensation grossly intact.  HEENT:[place]/AT, PERRLA, neck supple, No carotid bruits Skin: no gross rashes  Cardiac: Regular rate and rhythm Respiratory: Essentially clear to auscultation bilaterally. Not using accessory muscles, speaking in full sentences.  Abdominal: not grossly distended Musculoskeletal: Ambulates w/o diff, FROM * 4 ext.  Vasc: less 2 sec cap RF, warm and pink  Psych:  No HI/SI, judgement and insight good, Euthymic mood. Full Affect.    Recent Results (from the past 2160 hour(s))  TSH     Status: None   Collection Time: 06/18/17  8:43 AM  Result Value Ref Range   TSH 1.040 0.450 - 4.500 uIU/mL  VITAMIN D 25 Hydroxy (Vit-D Deficiency, Fractures)     Status: Abnormal   Collection Time: 06/18/17  8:43 AM  Result Value Ref Range   Vit D, 25-Hydroxy 25.4 (L) 30.0 - 100.0 ng/mL    Comment: Vitamin D deficiency has been defined by the Institute of Medicine and an Endocrine Society practice guideline as a level of serum 25-OH vitamin D less than 20 ng/mL (1,2). The Endocrine Society went on to further define vitamin D insufficiency as a level between 21 and 29 ng/mL (2). 1. IOM (Institute of Medicine). 2010. Dietary reference    intakes for calcium and D. [place]: The    [employer] Academies Press. 2. Holick MF, Binkley [place], Bischoff-Ferrari HA, et  al.    Evaluation, treatment, and prevention of vitamin D    deficiency: an Endocrine Society clinical practice    guideline. JCEM. 2011 Jul; 96(7):1911-30.   Lipid panel     Status: Abnormal   Collection Time: 06/18/17  8:43 AM  Result Value Ref Range   Cholesterol, Total 196 100 - 199 mg/dL   Triglycerides 846137 0 - 149 mg/dL   HDL 40 >96>39 mg/dL   VLDL Cholesterol Cal 27 5 - 40 mg/dL   LDL Calculated 295129 (H) 0 - 99 mg/dL   Chol/HDL Ratio 4.9 0.0 - 5.0 ratio    Comment:  T. Chol/HDL Ratio                                             Men  Women                               1/2 Avg.Risk  3.4    3.3                                   Avg.Risk  5.0    4.4                                2X Avg.Risk  9.6    7.1                                3X Avg.Risk 23.4   11.0   Hemoglobin A1c     Status: Abnormal   Collection Time: 06/18/17  8:43 AM  Result Value Ref Range   Hgb A1c MFr Bld 5.9 (H) 4.8 - 5.6 %    Comment:          Prediabetes: 5.7 - 6.4          Diabetes: >6.4          Glycemic control for adults with diabetes: <7.0    Est. average glucose Bld gHb Est-mCnc 123 mg/dL  Comprehensive metabolic panel     Status: Abnormal   Collection Time: 06/18/17  8:43 AM  Result Value Ref Range   Glucose 102 (H) 65 - 99 mg/dL   BUN 21 6 - 24 mg/dL   Creatinine, Ser 4.78 0.76 - 1.27 mg/dL   GFR calc non Af Amer 84 >59 mL/min/1.73   GFR calc Af Amer 97 >59 mL/min/1.73   BUN/Creatinine Ratio 21 (H) 9 - 20   Sodium 142 134 - 144 mmol/L   Potassium 4.7 3.5 - 5.2 mmol/L   Chloride 103 96 - 106 mmol/L   CO2 24 20 - 29 mmol/L   Calcium 9.5 8.7 - 10.2 mg/dL   Total Protein 6.9 6.0 - 8.5 g/dL   Albumin 4.5 3.5 - 5.5 g/dL   Globulin, Total 2.4 1.5 - 4.5 g/dL   Albumin/Globulin Ratio 1.9 1.2 - 2.2   Bilirubin Total 0.6 0.0 - 1.2 mg/dL   Alkaline Phosphatase 58 39 - 117 IU/L   AST 24 0 - 40 IU/L   ALT 20 0 - 44 IU/L  CBC with Differential/Platelet     Status: None     Collection Time: 06/18/17  8:43 AM  Result Value Ref Range   WBC 7.0 3.4 - 10.8 x10E3/uL   RBC 5.10 4.14 - 5.80 x10E6/uL   Hemoglobin 15.9 13.0 - 17.7 g/dL   Hematocrit 29.5 62.1 - 51.0 %   MCV 90 79 - 97 fL   MCH 31.2 26.6 - 33.0 pg   MCHC 34.7 31.5 - 35.7 g/dL   RDW 30.8 65.7 - 84.6 %   Platelets 209 150 - 379 x10E3/uL   Neutrophils 56 Not Estab. %   Lymphs 34 Not Estab. %   Monocytes 8 Not Estab. %  Eos 2 Not Estab. %   Basos 0 Not Estab. %   Neutrophils Absolute 4.0 1.4 - 7.0 x10E3/uL   Lymphocytes Absolute 2.4 0.7 - 3.1 x10E3/uL   Monocytes Absolute 0.5 0.1 - 0.9 x10E3/uL   EOS (ABSOLUTE) 0.1 0.0 - 0.4 x10E3/uL   Basophils Absolute 0.0 0.0 - 0.2 x10E3/uL   Immature Granulocytes 0 Not Estab. %   Immature Grans (Abs) 0.0 0.0 - 0.1 x10E3/uL

## 2017-06-10 ENCOUNTER — Other Ambulatory Visit: Payer: BC Managed Care – PPO

## 2017-06-18 ENCOUNTER — Other Ambulatory Visit: Payer: BC Managed Care – PPO

## 2017-06-18 ENCOUNTER — Other Ambulatory Visit: Payer: Self-pay

## 2017-06-18 DIAGNOSIS — Z0000 Encounter for general adult medical examination without abnormal findings: Secondary | ICD-10-CM

## 2017-06-19 LAB — CBC WITH DIFFERENTIAL/PLATELET
BASOS ABS: 0 10*3/uL (ref 0.0–0.2)
Basos: 0 %
EOS (ABSOLUTE): 0.1 10*3/uL (ref 0.0–0.4)
EOS: 2 %
HEMOGLOBIN: 15.9 g/dL (ref 13.0–17.7)
Hematocrit: 45.8 % (ref 37.5–51.0)
IMMATURE GRANS (ABS): 0 10*3/uL (ref 0.0–0.1)
Immature Granulocytes: 0 %
LYMPHS: 34 %
Lymphocytes Absolute: 2.4 10*3/uL (ref 0.7–3.1)
MCH: 31.2 pg (ref 26.6–33.0)
MCHC: 34.7 g/dL (ref 31.5–35.7)
MCV: 90 fL (ref 79–97)
MONOCYTES: 8 %
Monocytes Absolute: 0.5 10*3/uL (ref 0.1–0.9)
NEUTROS ABS: 4 10*3/uL (ref 1.4–7.0)
Neutrophils: 56 %
Platelets: 209 10*3/uL (ref 150–379)
RBC: 5.1 x10E6/uL (ref 4.14–5.80)
RDW: 13.4 % (ref 12.3–15.4)
WBC: 7 10*3/uL (ref 3.4–10.8)

## 2017-06-19 LAB — TSH: TSH: 1.04 u[IU]/mL (ref 0.450–4.500)

## 2017-06-19 LAB — LIPID PANEL
CHOL/HDL RATIO: 4.9 ratio (ref 0.0–5.0)
Cholesterol, Total: 196 mg/dL (ref 100–199)
HDL: 40 mg/dL (ref >39)
LDL CALC: 129 mg/dL — AB (ref 0–99)
TRIGLYCERIDES: 137 mg/dL (ref 0–149)
VLDL Cholesterol Cal: 27 mg/dL (ref 5–40)

## 2017-06-19 LAB — COMPREHENSIVE METABOLIC PANEL
ALT: 20 IU/L (ref 0–44)
AST: 24 IU/L (ref 0–40)
Albumin/Globulin Ratio: 1.9 (ref 1.2–2.2)
Albumin: 4.5 g/dL (ref 3.5–5.5)
Alkaline Phosphatase: 58 IU/L (ref 39–117)
BILIRUBIN TOTAL: 0.6 mg/dL (ref 0.0–1.2)
BUN/Creatinine Ratio: 21 — ABNORMAL HIGH (ref 9–20)
BUN: 21 mg/dL (ref 6–24)
CHLORIDE: 103 mmol/L (ref 96–106)
CO2: 24 mmol/L (ref 20–29)
CREATININE: 1.02 mg/dL (ref 0.76–1.27)
Calcium: 9.5 mg/dL (ref 8.7–10.2)
GFR calc non Af Amer: 84 mL/min/{1.73_m2} (ref >59)
GFR, EST AFRICAN AMERICAN: 97 mL/min/{1.73_m2} (ref >59)
GLUCOSE: 102 mg/dL — AB (ref 65–99)
Globulin, Total: 2.4 g/dL (ref 1.5–4.5)
Potassium: 4.7 mmol/L (ref 3.5–5.2)
Sodium: 142 mmol/L (ref 134–144)
TOTAL PROTEIN: 6.9 g/dL (ref 6.0–8.5)

## 2017-06-19 LAB — VITAMIN D 25 HYDROXY (VIT D DEFICIENCY, FRACTURES): Vit D, 25-Hydroxy: 25.4 ng/mL — ABNORMAL LOW (ref 30.0–100.0)

## 2017-06-19 LAB — HEMOGLOBIN A1C
ESTIMATED AVERAGE GLUCOSE: 123 mg/dL
Hgb A1c MFr Bld: 5.9 % — ABNORMAL HIGH (ref 4.8–5.6)

## 2017-07-08 ENCOUNTER — Encounter: Payer: Self-pay | Admitting: Family Medicine

## 2017-07-08 ENCOUNTER — Ambulatory Visit (INDEPENDENT_AMBULATORY_CARE_PROVIDER_SITE_OTHER): Payer: BC Managed Care – PPO | Admitting: Family Medicine

## 2017-07-08 VITALS — BP 129/84 | HR 86 | Ht 66.0 in | Wt 200.0 lb

## 2017-07-08 DIAGNOSIS — E669 Obesity, unspecified: Secondary | ICD-10-CM | POA: Insufficient documentation

## 2017-07-08 DIAGNOSIS — Z6832 Body mass index (BMI) 32.0-32.9, adult: Secondary | ICD-10-CM

## 2017-07-08 DIAGNOSIS — M1990 Unspecified osteoarthritis, unspecified site: Secondary | ICD-10-CM | POA: Insufficient documentation

## 2017-07-08 DIAGNOSIS — E7841 Elevated Lipoprotein(a): Secondary | ICD-10-CM

## 2017-07-08 DIAGNOSIS — E559 Vitamin D deficiency, unspecified: Secondary | ICD-10-CM | POA: Diagnosis not present

## 2017-07-08 DIAGNOSIS — E66811 Obesity, class 1: Secondary | ICD-10-CM

## 2017-07-08 DIAGNOSIS — R7303 Prediabetes: Secondary | ICD-10-CM | POA: Diagnosis not present

## 2017-07-08 DIAGNOSIS — J3089 Other allergic rhinitis: Secondary | ICD-10-CM | POA: Insufficient documentation

## 2017-07-08 MED ORDER — VITAMIN D (ERGOCALCIFEROL) 1.25 MG (50000 UNIT) PO CAPS: 50000.0000 [IU] | ORAL_CAPSULE | ORAL | 10 refills | Status: DC

## 2017-07-08 MED ORDER — VITAMIN D3 125 MCG (5000 UT) PO TABS: ORAL_TABLET | ORAL | 3 refills | Status: AC

## 2017-07-08 NOTE — Progress Notes (Signed)
Assessment and plan:  1. Vitamin D deficiency   2. Elevated lipoprotein(a)   3. Prediabetes   4. Class 1 obesity with body mass index (BMI) of 32.0 to 32.9 in adult, unspecified obesity type, unspecified whether serious comorbidity present    F/UP--> 4-16mo- reck Vit D, A1c, +/- FLP, bmp ----->   Come fasting for CPE next OV and we will recheck these labs   1. Vitamin D deficiency:  Pt recommended to take vitamin D3 supplements 5000 IUs 1x/day as well as prescription ergocalciferol 50,000 IUs 1x/week.  2. Hyperlipidemia:  Dietary and exercise guidelines discussed with patient. Recommended pt to reduce intake of saturated, trans fats and fatty carbohydrates. Handouts provided if desired. Demonstrated to patient his 10 year risk is 5.2%. Will recheck his cholesterol  in 6-12 months.  3. Prediabetes:  Dietary and exercise guidelines discussed with patient. Recommended eating low glycemic index foods. Handouts and information provided. Pt recommended to increase his water intake.  4. Class I Obesity:  Dietary and exercise guidelines discussed with patient.    Follow up in 4-6 months to recheck Vitamin D, A1c, possible fasting lipid profile, and BNP.   Pt was in the office today for 40+ minutes, with over 50% time spent in face to face counseling of patients various medical conditions, treatment plans of those medical conditions including medicine management and lifestyle modification, strategies to improve health and well being; and in coordination of care. SEE ABOVE FOR DETAILS    Education and routine counseling performed. Handouts provided.   Meds ordered this encounter  Medications  . Vitamin D, Ergocalciferol, (DRISDOL) 50000 units CAPS capsule    Sig: Take 1 capsule (50,000 Units total) by mouth every 7 (seven) days.    Dispense:  12 capsule    Refill:  10  . Cholecalciferol (VITAMIN D3) 5000 units TABS   Sig: 5,000 IU OTC vitamin D3 daily.    Dispense:  90 tablet    Refill:  3     Return for 4-79mo CPE- come fasting.   Anticipatory guidance and routine counseling done re: condition, txmnt options and need for follow up. All questions of patient's were answered.   Gross side effects, risk and benefits, and alternatives of medications discussed with patient.  Patient is aware that all medications have potential side effects and we are unable to predict every sideeffect or drug-drug interaction that may occur.  Expresses verbal understanding and consents to current therapy plan and treatment regiment.  Please see AVS handed out to patient at the end of our visit for additional patient instructions/ counseling done pertaining to today's office visit.  Note: This document was prepared using Dragon voice recognition software and may include unintentional dictation errors.  This document serves as a record of services personally performed by Thomasene Lot, DO. It was created on her behalf by Thelma Barge, a trained medical scribe. The creation of this record is based on the scribe's personal observations and the provider's statements to them.   I have reviewed the above medical documentation for accuracy and completeness and I concur.  Thomasene Lot 07/14/17 8:04 AM   ----------------------------------------------------------------------------------------------------------------------  Subjective:   CC:   Dale Singh is a 52 y.o. male who presents to [facility] today for review and discussion of recent bloodwork that was done. Pt notes he drinks 4-5 20 oz bottles of water/day, but states he sweats a lot throughout the day.  1.  All recent blood work that we ordered was reviewed with patient today.  Patient was counseled on all abnormalities and we discussed dietary and lifestyle changes that could help those values (also medications when appropriate).   Extensive health counseling performed and all patient's concerns/ questions were addressed.   Wt Readings from Last 3 Encounters:  07/08/17 200 lb (90.7 kg)  06/04/17 197 lb 11.2 oz (89.7 kg)  11/26/16 202 lb 3.2 oz (91.7 kg)   BP Readings from Last 3 Encounters:  07/08/17 129/84  06/04/17 138/80  11/26/16 (!) 154/88   Pulse Readings from Last 3 Encounters:  07/08/17 86  06/04/17 88  11/26/16 85   BMI Readings from Last 3 Encounters:  07/08/17 32.28 kg/m  06/04/17 31.91 kg/m  11/26/16 12.65 kg/m     Patient Care Team    Relationship Specialty Notifications Start End  Thomasene Lot, DO PCP - General Family Medicine  06/04/17     Full medical history updated and reviewed in the office today  Patient Active Problem List   Diagnosis Date Noted  . Class 1 obesity in adult 07/08/2017  . Environmental and seasonal allergies 07/08/2017  . Osteoarthritis 07/08/2017    Past Medical History:  Diagnosis Date  . Allergy   . Arthritis   . Asthma     Past Surgical History:  Procedure Laterality Date  . NECK SURGERY    . SPINE SURGERY      Social History   Tobacco Use  . Smoking status: Former Games developer  . Smokeless tobacco: Never Used  Substance Use Topics  . Alcohol use: Yes    Family Hx: Family History  Problem Relation Age of Onset  . Diabetes Mother   . Diabetes Father      Medications: Current Outpatient Medications  Medication Sig Dispense Refill  . Cholecalciferol (VITAMIN D3) 5000 units TABS 5,000 IU OTC vitamin D3 daily. 90 tablet 3  . Vitamin D, Ergocalciferol, (DRISDOL) 50000 units CAPS capsule Take 1 capsule (50,000 Units total) by mouth every 7 (seven) days. 12 capsule 10   No current facility-administered medications for this visit.     Allergies:  No Known Allergies   Review of Systems: General:   No F/C, wt loss Pulm:   No DIB, SOB, pleuritic chest pain Card:  No CP, palpitations Abd:  No n/v/d or pain Ext:  No inc edema from  baseline  Objective:  Blood pressure 129/84, pulse 86, height 5\' 6"  (1.676 m), weight 200 lb (90.7 kg), SpO2 99 %. Body mass index is 32.28 kg/m. Gen:   Well NAD, A and O *3 HEENT:    [place]/AT, EOMI,  MMM Lungs:   Normal work of breathing. CTA B/L, no Wh, rhonchi Heart:   RRR, S1, S2 WNL's, no MRG Abd:   No gross distention Exts:    warm, pink,  Brisk capillary refill, warm and well perfused.  Psych:    No HI/SI, judgement and insight good, Euthymic mood. Full Affect.   Recent Results (from the past 2160 hour(s))  TSH     Status: None   Collection Time: 06/18/17  8:43 AM  Result Value Ref Range   TSH 1.040 0.450 - 4.500 uIU/mL  VITAMIN D 25 Hydroxy (Vit-D Deficiency, Fractures)     Status: Abnormal   Collection Time: 06/18/17  8:43 AM  Result Value Ref Range   Vit D, 25-Hydroxy 25.4 (L) 30.0 - 100.0 ng/mL    Comment: Vitamin D deficiency has been defined by the Institute  of Medicine and an Endocrine Society practice guideline as a level of serum 25-OH vitamin D less than 20 ng/mL (1,2). The Endocrine Society went on to further define vitamin D insufficiency as a level between 21 and 29 ng/mL (2). 1. IOM (Institute of Medicine). 2010. Dietary reference    intakes for calcium and D. [place]: The    [employer]. 2. Holick MF, Binkley [place], Bischoff-Ferrari HA, et al.    Evaluation, treatment, and prevention of vitamin D    deficiency: an Endocrine Society clinical practice    guideline. JCEM. 2011 Jul; 96(7):1911-30.   Lipid panel     Status: Abnormal   Collection Time: 06/18/17  8:43 AM  Result Value Ref Range   Cholesterol, Total 196 100 - 199 mg/dL   Triglycerides 161137 0 - 149 mg/dL   HDL 40 >09>39 mg/dL   VLDL Cholesterol Cal 27 5 - 40 mg/dL   LDL Calculated 604129 (H) 0 - 99 mg/dL   Chol/HDL Ratio 4.9 0.0 - 5.0 ratio    Comment:                                   T. Chol/HDL Ratio                                             Men  Women                                1/2 Avg.Risk  3.4    3.3                                   Avg.Risk  5.0    4.4                                2X Avg.Risk  9.6    7.1                                3X Avg.Risk 23.4   11.0   Hemoglobin A1c     Status: Abnormal   Collection Time: 06/18/17  8:43 AM  Result Value Ref Range   Hgb A1c MFr Bld 5.9 (H) 4.8 - 5.6 %    Comment:          Prediabetes: 5.7 - 6.4          Diabetes: >6.4          Glycemic control for adults with diabetes: <7.0    Est. average glucose Bld gHb Est-mCnc 123 mg/dL  Comprehensive metabolic panel     Status: Abnormal   Collection Time: 06/18/17  8:43 AM  Result Value Ref Range   Glucose 102 (H) 65 - 99 mg/dL   BUN 21 6 - 24 mg/dL   Creatinine, Ser 5.401.02 0.76 - 1.27 mg/dL   GFR calc non Af Amer 84 >59 mL/min/1.73   GFR calc Af Amer 97 >59 mL/min/1.73   BUN/Creatinine Ratio 21 (H) 9 - 20   Sodium 142 134 - 144 mmol/L   Potassium 4.7  3.5 - 5.2 mmol/L   Chloride 103 96 - 106 mmol/L   CO2 24 20 - 29 mmol/L   Calcium 9.5 8.7 - 10.2 mg/dL   Total Protein 6.9 6.0 - 8.5 g/dL   Albumin 4.5 3.5 - 5.5 g/dL   Globulin, Total 2.4 1.5 - 4.5 g/dL   Albumin/Globulin Ratio 1.9 1.2 - 2.2   Bilirubin Total 0.6 0.0 - 1.2 mg/dL   Alkaline Phosphatase 58 39 - 117 IU/L   AST 24 0 - 40 IU/L   ALT 20 0 - 44 IU/L  CBC with Differential/Platelet     Status: None   Collection Time: 06/18/17  8:43 AM  Result Value Ref Range   WBC 7.0 3.4 - 10.8 x10E3/uL   RBC 5.10 4.14 - 5.80 x10E6/uL   Hemoglobin 15.9 13.0 - 17.7 g/dL   Hematocrit 16.1 09.6 - 51.0 %   MCV 90 79 - 97 fL   MCH 31.2 26.6 - 33.0 pg   MCHC 34.7 31.5 - 35.7 g/dL   RDW 04.5 40.9 - 81.1 %   Platelets 209 150 - 379 x10E3/uL   Neutrophils 56 Not Estab. %   Lymphs 34 Not Estab. %   Monocytes 8 Not Estab. %   Eos 2 Not Estab. %   Basos 0 Not Estab. %   Neutrophils Absolute 4.0 1.4 - 7.0 x10E3/uL   Lymphocytes Absolute 2.4 0.7 - 3.1 x10E3/uL   Monocytes Absolute 0.5 0.1 - 0.9 x10E3/uL   EOS (ABSOLUTE)  0.1 0.0 - 0.4 x10E3/uL   Basophils Absolute 0.0 0.0 - 0.2 x10E3/uL   Immature Granulocytes 0 Not Estab. %   Immature Grans (Abs) 0.0 0.0 - 0.1 x10E3/uL

## 2017-07-08 NOTE — Patient Instructions (Addendum)
[id number]- reck Vit D, A1c, +/- FLP, bmp----->   Come fasting for CPE next OV and we will recheck these labs      Guidelines for a Low Cholesterol, Low Saturated Fat Diet   Fats - Limit total intake of fats and oils. - Avoid butter, stick margarine, shortening, lard, palm and coconut oils. - Limit mayonnaise, salad dressings, gravies and sauces, unless they are homemade with low-fat ingredients. - Limit chocolate. - Choose low-fat and nonfat products, such as low-fat mayonnaise, low-fat or non-hydrogenated peanut butter, low-fat or fat-free salad dressings and nonfat gravy. - Use vegetable oil, such as canola or olive oil. - Look for margarine that does not contain trans fatty acids. - Use nuts in moderate amounts. - Read ingredient labels carefully to determine both amount and type of fat present in foods. Limit saturated and trans fats! - Avoid high-fat processed and convenience foods.  Meats and Meat Alternatives - Choose fish, chicken, [place] and lean meats. - Use dried beans, peas, lentils and tofu. - Limit egg yolks to three to four per week. - If you eat red meat, limit to no more than three servings per week and choose loin or round cuts. - Avoid fatty meats, such as bacon, sausage, franks, luncheon meats and ribs. - Avoid all organ meats, including liver.  Dairy - Choose nonfat or low-fat milk, yogurt and cottage cheese. - Most cheeses are high in fat. Choose cheeses made from non-fat milk, such as mozzarella and ricotta cheese. - Choose light or fat-free cream cheese and sour cream. - Avoid cream and sauces made with cream.  Fruits and Vegetables - Eat a wide variety of fruits and vegetables. - Use lemon juice, vinegar or "mist" olive oil on vegetables. - Avoid adding sauces, fat or oil to vegetables.  Breads, Cereals and Grains - Choose whole-grain breads, cereals, pastas and rice. - Avoid high-fat snack foods, such as granola, cookies, pies, pastries, doughnuts and  croissants.  Cooking Tips - Avoid deep fried foods. - Trim visible fat off meats and remove skin from poultry before cooking. - Bake, broil, boil, poach or roast poultry, fish and lean meats. - Drain and discard fat that drains out of meat as you cook it. - Add little or no fat to foods. - Use vegetable oil sprays to grease pans for cooking or baking. - Steam vegetables. - Use herbs or no-oil marinades to flavor foods.     Please realize, EXERCISE IS MEDICINE!  -  American Heart Association [facility]( AHA) guidelines for exercise : If you are in good health, without any medical conditions, you should engage in 150 minutes of moderate intensity aerobic activity per week.  This means you should be huffing and puffing throughout your workout.   Engaging in regular exercise will improve brain function and memory, as well as improve mood, boost immune system and help with weight management.  As well as the other, more well-known effects of exercise such as decreasing blood sugar levels, decreasing blood pressure,  and decreasing bad cholesterol levels/ increasing good cholesterol levels.     -  The AHA strongly endorses consumption of a diet that contains a variety of foods from all the food categories with an emphasis on fruits and vegetables; fat-free and low-fat dairy products; cereal and grain products; legumes and nuts; and fish, poultry, and/or extra lean meats.    Excessive food intake, especially of foods high in saturated and trans fats, sugar, and salt, should be avoided.  Adequate water intake of roughly 1/2 of your weight in pounds, should equal the ounces of water per day you should drink.  So for instance, if you're 200 pounds, that would be 100 ounces of water per day.         Mediterranean Diet  Why follow it? Research shows. . Those who follow the Mediterranean diet have a reduced risk of heart disease  . The diet is associated with a reduced incidence of Parkinson's and Alzheimer's  diseases . People following the diet may have longer life expectancies and lower rates of chronic diseases  . The Dietary Guidelines for Americans recommends the Mediterranean diet as an eating plan to promote health and prevent disease  What Is the Mediterranean Diet?  . Healthy eating plan based on typical foods and recipes of Mediterranean-style cooking . The diet is primarily a plant based diet; these foods should make up a majority of meals   Starches - Plant based foods should make up a majority of meals - They are an important sources of vitamins, minerals, energy, antioxidants, and fiber - Choose whole grains, foods high in fiber and minimally processed items  - Typical grain sources include wheat, oats, barley, corn, brown rice, bulgar, farro, millet, polenta, couscous  - Various types of beans include chickpeas, lentils, fava beans, black beans, white beans   Fruits  Veggies - Large quantities of antioxidant rich fruits & veggies; 6 or more servings  - Vegetables can be eaten raw or lightly drizzled with oil and cooked  - Vegetables common to the traditional Mediterranean Diet include: artichokes, arugula, beets, broccoli, brussel sprouts, cabbage, carrots, celery, collard greens, cucumbers, eggplant, kale, leeks, lemons, lettuce, mushrooms, okra, onions, peas, peppers, potatoes, pumpkin, radishes, rutabaga, shallots, spinach, sweet potatoes, turnips, zucchini - Fruits common to the Mediterranean Diet include: apples, apricots, avocados, cherries, clementines, dates, figs, grapefruits, grapes, melons, nectarines, oranges, peaches, pears, pomegranates, strawberries, tangerines  Fats - Replace butter and margarine with healthy oils, such as olive oil, canola oil, and tahini  - Limit nuts to no more than a handful a day  - Nuts include walnuts, almonds, pecans, pistachios, pine nuts  - Limit or avoid candied, honey roasted or heavily salted nuts - Olives are central to the [employer] - can be eaten whole or used in a variety of dishes   Meats Protein - Limiting red meat: no more than a few times a month - When eating red meat: choose lean cuts and keep the portion to the size of deck of cards - Eggs: approx. 0 to 4 times a week  - Fish and lean poultry: at least 2 a week  - Healthy protein sources include, chicken, [place], lean beef, lamb - Increase intake of seafood such as tuna, salmon, trout, mackerel, shrimp, scallops - Avoid or limit high fat processed meats such as sausage and bacon  Dairy - Include moderate amounts of low fat dairy products  - Focus on healthy dairy such as fat free yogurt, skim milk, low or reduced fat cheese - Limit dairy products higher in fat such as whole or 2% milk, cheese, ice cream  Alcohol - Moderate amounts of red wine is ok  - No more than 5 oz daily for women (all ages) and men older than age 78  - No more than 10 oz of wine daily for men younger than 5  Other - Limit sweets and other desserts  - Use herbs and spices instead of salt  to flavor foods  - Herbs and spices common to the traditional Mediterranean Diet include: basil, bay leaves, chives, cloves, cumin, fennel, garlic, lavender, marjoram, mint, oregano, parsley, pepper, rosemary, sage, savory, sumac, tarragon, thyme   It's not just a diet, it's a lifestyle:  . The Mediterranean diet includes lifestyle factors typical of those in the region  . Foods, drinks and meals are best eaten with others and savored . Daily physical activity is important for overall good health . This could be strenuous exercise like running and aerobics . This could also be more leisurely activities such as walking, housework, yard-work, or taking the stairs . Moderation is the key; a balanced and healthy diet accommodates most foods and drinks . Consider portion sizes and frequency of consumption of certain foods   Meal Ideas & Options:  . Breakfast:  o Whole wheat toast or whole wheat English  muffins with peanut butter & hard boiled egg o Steel cut oats topped with apples & cinnamon and skim milk  o Fresh fruit: banana, strawberries, melon, berries, peaches  o Smoothies: strawberries, bananas, greek yogurt, peanut butter o Low fat greek yogurt with blueberries and granola  o Egg white omelet with spinach and mushrooms o Breakfast couscous: whole wheat couscous, apricots, skim milk, cranberries  . Sandwiches:  o Hummus and grilled vegetables (peppers, zucchini, squash) on whole wheat bread   o Grilled chicken on whole wheat pita with lettuce, tomatoes, cucumbers or tzatziki  o Tuna salad on whole wheat bread: tuna salad made with greek yogurt, olives, red peppers, capers, green onions o Garlic rosemary lamb pita: lamb sauted with garlic, rosemary, salt & pepper; add lettuce, cucumber, greek yogurt to pita - flavor with lemon juice and black pepper  . Seafood:  o Mediterranean grilled salmon, seasoned with garlic, basil, parsley, lemon juice and black pepper o Shrimp, lemon, and spinach whole-grain pasta salad made with low fat greek yogurt  o Seared scallops with lemon orzo  o Seared tuna steaks seasoned salt, pepper, coriander topped with tomato mixture of olives, tomatoes, olive oil, minced garlic, parsley, green onions and cappers  . Meats:  o Herbed greek chicken salad with kalamata olives, cucumber, feta  o Red bell peppers stuffed with spinach, bulgur, lean ground beef (or lentils) & topped with feta   o Kebabs: skewers of chicken, tomatoes, onions, zucchini, squash  o [place] burgers: made with red onions, mint, dill, lemon juice, feta cheese topped with roasted red peppers . Vegetarian o Cucumber salad: cucumbers, artichoke hearts, celery, red onion, feta cheese, tossed in olive oil & lemon juice  o Hummus and whole grain pita points with a greek salad (lettuce, tomato, feta, olives, cucumbers, red onion) o Lentil soup with celery, carrots made with vegetable broth,  garlic, salt and pepper  o Tabouli salad: parsley, bulgur, mint, scallions, cucumbers, tomato, radishes, lemon juice, olive oil, salt and pepper.     Risk factors for prediabetes and type 2 diabetes  Researchers don't fully understand why some people develop prediabetes and type 2 diabetes and others don't.  It's clear that certain factors increase the risk, however, including:  Weight. The more fatty tissue you have, the more resistant your cells become to insulin.  Inactivity. The less active you are, the greater your risk. Physical activity helps you control your weight, uses up glucose as energy and makes your cells more sensitive to insulin.  Family history. Your risk increases if a parent or sibling has type 2 diabetes.  Race. Although it's unclear why, people of certain races - including blacks, Hispanics, American Indians and Asian-Americans - are at higher risk.  Age. Your risk increases as you get older. This may be because you tend to exercise less, lose muscle mass and gain weight as you age. But type 2 diabetes is also increasing dramatically among children, adolescents and younger adults.  Gestational diabetes. If you developed gestational diabetes when you were pregnant, your risk of developing prediabetes and type 2 diabetes later increases. If you gave birth to a baby weighing more than 9 pounds (4 kilograms), you're also at risk of type 2 diabetes.  Polycystic ovary syndrome. For women, having polycystic ovary syndrome - a common condition characterized by irregular menstrual periods, excess hair growth and obesity - increases the risk of diabetes.  High blood pressure. Having blood pressure over 140/90 millimeters of mercury (mm Hg) is linked to an increased risk of type 2 diabetes.  Abnormal cholesterol and triglyceride levels. If you have low levels of high-density lipoprotein (HDL), or "good," cholesterol, your risk of type 2 diabetes is higher. Triglycerides are another type  of fat carried in the blood. People with high levels of triglycerides have an increased risk of type 2 diabetes. Your doctor can let you know what your cholesterol and triglyceride levels are.  A good guide to good carbs: The glycemic index ---If you have diabetes, or at risk for diabetes, you know all too well that when you eat carbohydrates, your blood sugar goes up. The total amount of carbs you consume at a meal or in a snack mostly determines what your blood sugar will do. But the food itself also plays a role. A serving of white rice has almost the same effect as eating pure table sugar - a quick, high spike in blood sugar. A serving of lentils has a slower, smaller effect.  ---Picking good sources of carbs can help you control your blood sugar and your weight. Even if you don't have diabetes, eating healthier carbohydrate-rich foods can help ward off a host of chronic conditions, from heart disease to various cancers to, well, diabetes.  ---One way to choose foods is with the glycemic index (GI). This tool measures how much a food boosts blood sugar.  The glycemic index rates the effect of a specific amount of a food on blood sugar compared with the same amount of pure glucose. A food with a glycemic index of 28 boosts blood sugar only 28% as much as pure glucose. One with a GI of 95 acts like pure glucose.    High glycemic foods result in a quick spike in insulin and blood sugar (also known as blood glucose).  Low glycemic foods have a slower, smaller effect- these are healthier for you.   Using the glycemic index Using the glycemic index is easy: choose foods in the low GI category instead of those in the high GI category (see below), and go easy on those in between. Low glycemic index (GI of 55 or less): Most fruits and vegetables, beans, minimally processed grains, pasta, low-fat dairy foods, and nuts.  Moderate glycemic index (GI 56 to 69): White and sweet potatoes, corn, white rice,  couscous, breakfast cereals such as Cream of Wheat and Mini Wheats.  High glycemic index (GI of 70 or higher): White bread, rice cakes, most crackers, bagels, cakes, doughnuts, croissants, most packaged breakfast cereals. You can see the values for 100 commons foods and get links to more at www.health.[employer]Debt.hu.  Swaps for lowering glycemic index  Instead of this high-glycemic index food Eat this lower-glycemic index food  White rice Brown rice or converted rice  Instant oatmeal Steel-cut oats  Cornflakes Bran flakes  Baked potato Pasta, bulgur  White bread Whole-grain bread  Corn Peas or leafy greens       Prediabetes Eating Plan  Prediabetes--also called impaired glucose tolerance or impaired fasting glucose--is a condition that causes blood sugar (blood glucose) levels to be higher than normal. Following a healthy diet can help to keep prediabetes under control. It can also help to lower the risk of type 2 diabetes and heart disease, which are increased in people who have prediabetes. Along with regular exercise, a healthy diet:  Promotes weight loss.  Helps to control blood sugar levels.  Helps to improve the way that the body uses insulin.   WHAT DO I NEED TO KNOW ABOUT THIS EATING PLAN?   Use the glycemic index (GI) to plan your meals. The index tells you how quickly a food will raise your blood sugar. Choose low-GI foods. These foods take a longer time to raise blood sugar.  Pay close attention to the amount of carbohydrates in the food that you eat. Carbohydrates increase blood sugar levels.  Keep track of how many calories you take in. Eating the right amount of calories will help you to achieve a healthy weight. Losing about 7 percent of your starting weight can help to prevent type 2 diabetes.  You may want to follow a Mediterranean diet. This diet includes a lot of vegetables, lean meats or fish, whole grains, fruits, and healthy oils and fats.   WHAT  FOODS CAN I EAT?  Grains Whole grains, such as whole-wheat or whole-grain breads, crackers, cereals, and pasta. Unsweetened oatmeal. Bulgur. Barley. Quinoa. Brown rice. Corn or whole-wheat flour tortillas or taco shells. Vegetables Lettuce. Spinach. Peas. Beets. Cauliflower. Cabbage. Broccoli. Carrots. Tomatoes. Squash. Eggplant. Herbs. Peppers. Onions. Cucumbers. Brussels sprouts. Fruits Berries. Bananas. Apples. Oranges. Grapes. Papaya. Mango. Pomegranate. Kiwi. Grapefruit. Cherries. Meats and Other Protein Sources Seafood. Lean meats, such as chicken and [place] or lean cuts of pork and beef. Tofu. Eggs. Nuts. Beans. Dairy Low-fat or fat-free dairy products, such as yogurt, cottage cheese, and cheese. Beverages Water. Tea. Coffee. Sugar-free or diet soda. Seltzer water. Milk. Milk alternatives, such as soy or almond milk. Condiments Mustard. Relish. Low-fat, low-sugar ketchup. Low-fat, low-sugar barbecue sauce. Low-fat or fat-free mayonnaise. Sweets and Desserts Sugar-free or low-fat pudding. Sugar-free or low-fat ice cream and other frozen treats. Fats and Oils Avocado. Walnuts. Olive oil. The items listed above may not be a complete list of recommended foods or beverages. Contact your dietitian for more options.    WHAT FOODS ARE NOT RECOMMENDED?  Grains Refined white flour and flour products, such as bread, pasta, snack foods, and cereals. Beverages Sweetened drinks, such as sweet iced tea and soda. Sweets and Desserts Baked goods, such as cake, cupcakes, pastries, cookies, and cheesecake. The items listed above may not be a complete list of foods and beverages to avoid. Contact your dietitian for more information.   This information is not intended to replace advice given to you by your health care provider. Make sure you discuss any questions you have with your health care provider.   Document Released: 11/01/2014 Document Reviewed: 11/01/2014 Elsevier Interactive Patient  Education [employer]

## 2018-01-12 ENCOUNTER — Encounter: Payer: BC Managed Care – PPO | Admitting: Family Medicine

## 2018-04-15 ENCOUNTER — Encounter: Payer: Self-pay | Admitting: Family Medicine

## 2018-04-15 ENCOUNTER — Ambulatory Visit (INDEPENDENT_AMBULATORY_CARE_PROVIDER_SITE_OTHER): Payer: BC Managed Care – PPO | Admitting: Family Medicine

## 2018-04-15 VITALS — BP 130/89 | HR 67 | Ht 66.0 in | Wt 198.5 lb

## 2018-04-15 DIAGNOSIS — M722 Plantar fascial fibromatosis: Secondary | ICD-10-CM | POA: Insufficient documentation

## 2018-04-15 DIAGNOSIS — E559 Vitamin D deficiency, unspecified: Secondary | ICD-10-CM | POA: Diagnosis not present

## 2018-04-15 DIAGNOSIS — Q667 Congenital pes cavus, unspecified foot: Secondary | ICD-10-CM | POA: Diagnosis not present

## 2018-04-15 DIAGNOSIS — E785 Hyperlipidemia, unspecified: Secondary | ICD-10-CM | POA: Insufficient documentation

## 2018-04-15 DIAGNOSIS — R030 Elevated blood-pressure reading, without diagnosis of hypertension: Secondary | ICD-10-CM

## 2018-04-15 DIAGNOSIS — R7303 Prediabetes: Secondary | ICD-10-CM | POA: Insufficient documentation

## 2018-04-15 DIAGNOSIS — L600 Ingrowing nail: Secondary | ICD-10-CM

## 2018-04-15 NOTE — Patient Instructions (Addendum)
-Melissa please put full set of labs for future except for CBC and TSH.  Thank you.    As we discussed you do not have an ingrown toenail but the start of one possibly.  After showering or soaking your feet, please again pull back the skin and try to lift the nail up and out of the nail fold on the inside of your right toe.   If this changes, becomes more red, pussy, increases pain and inflammation etc. please let us know.      Ingrown Toenail An ingrown toenail occurs when the corner or sides of your toenail grow into the surrounding skin. The big toe is most commonly affected, but it can happen to any of your toes. If your ingrown toenail is not treated, you will be at risk for infection. What are the causes? This condition may be caused by:  Wearing shoes that are too small or tight.  Injury or trauma, such as stubbing your toe or having your toe stepped on.  Improper cutting or care of your toenails.  Being born with (congenital) nail or foot abnormalities, such as having a nail that is too big for your toe.  What increases the risk? Risk factors for an ingrown toenail include:  Age. Your nails tend to thicken as you get older, so ingrown nails are more common in older people.  Diabetes.  Cutting your toenails incorrectly.  Blood circulation problems.  What are the signs or symptoms? Symptoms may include:  Pain, soreness, or tenderness.  Redness.  Swelling.  Hardening of the skin surrounding the toe.  Your ingrown toenail may be infected if there is fluid, pus, or drainage. How is this diagnosed? An ingrown toenail may be diagnosed by medical history and physical exam. If your toenail is infected, your health care provider may test a sample of the drainage. How is this treated? Treatment depends on the severity of your ingrown toenail. Some ingrown toenails may be treated at home. More severe or infected ingrown toenails may require surgery to remove all or part  of the nail. Infected ingrown toenails may also be treated with antibiotic medicines. Follow these instructions at home:  If you were prescribed an antibiotic medicine, finish all of it even if you start to feel better.  Soak your foot in warm soapy water for 20 minutes, 3 times per day or as directed by your health care provider.  Carefully lift the edge of the nail away from the sore skin by wedging a small piece of cotton under the corner of the nail. This may help with the pain. Be careful not to cause more injury to the area.  Wear shoes that fit well. If your ingrown toenail is causing you pain, try wearing sandals, if possible.  Trim your toenails regularly and carefully. Do not cut them in a curved shape. Cut your toenails straight across. This prevents injury to the skin at the corners of the toenail.  Keep your feet clean and dry.  If you are having trouble walking and are given crutches by your health care provider, use them as directed.  Do not pick at your toenail or try to remove it yourself.  Take medicines only as directed by your health care provider.  Keep all follow-up visits as directed by your health care provider. This is important. Contact a health care provider if:  Your symptoms do not improve with treatment. Get help right away if:  You have red streaks that  start at your foot and go up your leg.  You have a fever.  You have increased redness, swelling, or pain.  You have fluid, blood, or pus coming from your toenail. This information is not intended to replace advice given to you by your health care provider. Make sure you discuss any questions you have with your health care provider. Document Released: 06/14/2000 Document Revised: 11/17/2015 Document Reviewed: 05/11/2014 Elsevier Interactive Patient Education  [employer].

## 2018-04-15 NOTE — Progress Notes (Signed)
Pt here for an acute care OV today   Impression and Recommendations:    1. Plantar fasciitis, bilateral   2. High arches   3. start of Ingrown toenail without infection   4. Plantar fibromatosis   5. Vitamin D deficiency   6. Prediabetes   7. Hyperlipidemia, unspecified hyperlipidemia type   8. Elevated blood pressure reading    Plantar Fasciitis/Plantar fibromatosis/High Arches -Referred to podiatry -Explained basic anatomy of plantar fascia, achilles tendon and muscles in the foot -Discussed the role of podiatry in caring for feet and treating soft tissue or gait issues -Educated pt about how high arches transfer weight differently and place stress on different parts of the feet -Discussed the possible need for custom orthotics and need for support for high arches to help with weight distribution -Discussed how plantar fasciitis can cause pain with different movements of the feet  Ingrown toenail -Instructed pt to use a nail file on the toe that is bothering him in order to avoid an ingrown toenail -Discussed possibility of mild gout with age  -Discussed how ingrown toenails form by the nails growing down into the skin -Instructed pt to soak feet in water, hydrogen peroxide and rubbing alcohol to help avoid infection and soften feet -Instructed pt to pull the skin away from the nail each day after showering or soaking his feet in order to allow the skin to heal -Educated pt about the symptoms of ingrown toenail and to contact us if it starts to show pus and becomes very inflamed  Health Management -Encouraged pt to return for FBW and management of chronic issues -Explained the importance regular care to prevent chronic diseases   There are no discontinued medications.    Education and routine counseling performed. Handouts provided  Gross side effects, risk and benefits, and alternatives of medications and treatment plan in general discussed with patient.  Patient is  aware that all medications have potential side effects and we are unable to predict every side effect or drug-drug interaction that may occur.   Patient will call with any questions prior to using medication if they have concerns.    Expresses verbal understanding and consents to current therapy and treatment regimen.  No barriers to understanding were identified.  Red flag symptoms and signs discussed in detail.  Patient expressed understanding regarding what to do in case of emergency\urgent symptoms   Please see AVS handed out to patient at the end of our visit for further patient instructions/ counseling done pertaining to today's office visit.   Return for f/up next available for FBW then OV with me- for HLD, BP, pre-DM, Vit D def etc.     Note:  This document was prepared occasionally using Dragon voice recognition software and may include unintentional dictation errors in addition to a scribe.  This document serves as a record of services personally performed by Thomasene Lot, MD. It was created on her behalf by Alphonse Guild, a trained medical scribe. The creation of this record is based on the scribe's personal observations and the provider's statements to them.   I have reviewed the above medical documentation for accuracy and completeness and I concur.  Thomasene Lot 04/15/18 11:14 AM      --------------------------------------------------------------------------------------------------------------------------------------------------------------------------------------------------------------------------------------------    Subjective:    CC:  Chief Complaint  Patient presents with  . bilat foot pain and joint pain    HPI: Dale Singh is a 52 y.o. male who presents to [facility] at [facility] today for issues as discussed below.  Acute foot pain -Says when he steps off it feels like fluid is shooting into his toes -Only hurts when he  bends his toes to step off but not when standing flat  -Started last week  -Says he has noticed swelling around the back of his feet -Started last week   -Has soreness in the joint of his big toe -Started a few weeks ago -Noticed some redness last week   -Has pain upon standing in the morning when he wakes up in the bottom of his feet    Problem  Hyperlipidemia  Prediabetes  Vitamin D Deficiency  Plantar Fibromatosis  High Arches  Plantar Fasciitis, Bilateral  Elevated Blood Pressure Reading     Wt Readings from Last 3 Encounters:  04/15/18 198 lb 8 oz (90 kg)  07/08/17 200 lb (90.7 kg)  06/04/17 197 lb 11.2 oz (89.7 kg)   BP Readings from Last 3 Encounters:  04/15/18 130/89  07/08/17 129/84  06/04/17 138/80   BMI Readings from Last 3 Encounters:  04/15/18 32.04 kg/m  07/08/17 32.28 kg/m  06/04/17 31.91 kg/m    Patient Care Team    Relationship Specialty Notifications Start End  Thomasene Lot, DO PCP - General Family Medicine  06/04/17     Patient Active Problem List   Diagnosis Date Noted  . Hyperlipidemia 04/15/2018  . Prediabetes 04/15/2018  . Vitamin D deficiency 04/15/2018  . 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 High arches 04/15/2018  . Plantar fasciitis, bilateral 04/15/2018  . Elevated blood pressure reading 04/15/2018  . Class 1 obesity in adult 07/08/2017  . Environmental and seasonal allergies 07/08/2017  . Osteoarthritis 07/08/2017    Past Medical History:  Diagnosis Date  . Allergy   . Arthritis   . Asthma      Past Surgical History:  Procedure Laterality Date  . NECK SURGERY    . SPINE SURGERY       Family History  Problem Relation Age of Onset  . Diabetes Mother   . Diabetes Father      Social History   Socioeconomic History  . Marital status: Single    Spouse name: Not on file  . Number of children: Not on file  . Years of education: Not on file  . Highest education level: Not on file  Occupational History  .  Not on file  Social Needs  . Financial resource strain: Not on file  . Food insecurity:    Worry: Not on file    Inability: Not on file  . Transportation needs:    Medical: Not on file    Non-medical: Not on file  Tobacco Use  . Smoking status: Former Games developer  . Smokeless tobacco: Never Used  Substance and Sexual Activity  . Alcohol use: Yes  . Drug use: No  . Sexual activity: Not on file  Lifestyle  . Physical activity:    Days per week: Not on file    Minutes per session: Not on file  . Stress: Not on file  Relationships  . Social connections:    Talks on phone: Not on file    Gets together: Not on file    Attends religious service: Not on file    Active member of club or organization: Not on file    Attends meetings of clubs or organizations: Not on file    Relationship status: Not on file  . Intimate partner violence:  Fear of current or ex partner: Not on file    Emotionally abused: Not on file    Physically abused: Not on file    Forced sexual activity: Not on file  Other Topics Concern  . Not on file  Social History Narrative  . Not on file     No outpatient medications have been marked as taking for the 04/15/18 encounter (Office Visit) with Thomasene Lot, DO.    Allergies:  No Known Allergies   Review of Systems: General:   Denies fever, chills, unexplained weight loss.  Optho/Auditory:   Denies visual changes, blurred vision/LOV Respiratory:   Denies wheeze, DOE more than baseline levels.   Cardiovascular:   Denies chest pain, palpitations, new onset peripheral edema  Gastrointestinal:   Denies nausea, vomiting, diarrhea, abd pain.  Genitourinary: Denies dysuria, freq/ urgency, flank pain or discharge from genitals.  Endocrine:     Denies hot or cold intolerance, polyuria, polydipsia. Musculoskeletal:   Denies unexplained myalgias, joint swelling, unexplained arthralgias, gait problems.  Skin:  Denies new onset rash, suspicious  lesions Neurological:     Denies dizziness, unexplained weakness, numbness  Psychiatric/Behavioral:   Denies mood changes, suicidal or homicidal ideations, hallucinations    Objective:   Blood pressure 130/89, pulse 67, height 5\' 6"  (1.676 m), weight 198 lb 8 oz (90 kg), SpO2 99 %. Body mass index is 32.04 kg/m. General:  Well Developed, well nourished, appropriate for stated age.  Neuro:  Alert and oriented,  extra-ocular muscles intact  HEENT:  Normocephalic, atraumatic, neck supple Skin:  no gross rash, warm, pink. Cardiac:  RRR, S1 S2 Respiratory:  ECTA B/L and A/P, Not using accessory muscles, speaking in full sentences- unlabored. Vascular:  Ext warm, no cyanosis apprec.; cap RF less 2 sec. Psych:  No HI/SI, judgement and insight good, Euthymic mood. Full Affect. Musculoskeletal  -Right medial aspect first digit slight erythematous, no swelling, not TTP in SQ tissue, or joint or along the bony ray.  No pain with movement first MTP.  -High arches. Mild discomfort on forced dorsiflexion of foot on the left. - Echymosis: no - Edema: not appreciated - ROM: full LE B - Gait: heel toe, non-antalgic - MT pain: no - Callus pattern: none Lateral Mall: NT b/l Medial Mall: NT b/l Calcaneous: NT, post R heel with bogginess at superior apsect of calcaneus, no TTP though Metatarsals: NT b/l 5th MT: NT b/l Phalanges: NT Achilles: NT Plantar Fascia: NT Post Tib: NT b/l Great Toe: Nml motion b/l Ant Drawer: neg ATFL: NT CFL: NT Deltoid: NT Sensation: intact

## 2018-04-23 ENCOUNTER — Other Ambulatory Visit: Payer: Self-pay

## 2018-04-23 DIAGNOSIS — Z0000 Encounter for general adult medical examination without abnormal findings: Secondary | ICD-10-CM

## 2018-04-23 DIAGNOSIS — R7303 Prediabetes: Secondary | ICD-10-CM

## 2018-04-23 DIAGNOSIS — E785 Hyperlipidemia, unspecified: Secondary | ICD-10-CM

## 2018-04-23 DIAGNOSIS — E559 Vitamin D deficiency, unspecified: Secondary | ICD-10-CM

## 2018-04-24 ENCOUNTER — Other Ambulatory Visit: Payer: BC Managed Care – PPO

## 2018-04-24 DIAGNOSIS — Z0000 Encounter for general adult medical examination without abnormal findings: Secondary | ICD-10-CM

## 2018-04-24 DIAGNOSIS — E559 Vitamin D deficiency, unspecified: Secondary | ICD-10-CM

## 2018-04-24 DIAGNOSIS — R7303 Prediabetes: Secondary | ICD-10-CM

## 2018-04-24 DIAGNOSIS — E785 Hyperlipidemia, unspecified: Secondary | ICD-10-CM

## 2018-04-25 LAB — CBC WITH DIFFERENTIAL/PLATELET
BASOS: 1 %
Basophils Absolute: 0.1 10*3/uL (ref 0.0–0.2)
EOS (ABSOLUTE): 0.1 10*3/uL (ref 0.0–0.4)
Eos: 1 %
Hematocrit: 45.4 % (ref 37.5–51.0)
Hemoglobin: 15.2 g/dL (ref 13.0–17.7)
Immature Grans (Abs): 0 10*3/uL (ref 0.0–0.1)
Immature Granulocytes: 0 %
LYMPHS ABS: 2.4 10*3/uL (ref 0.7–3.1)
Lymphs: 28 %
MCH: 30.8 pg (ref 26.6–33.0)
MCHC: 33.5 g/dL (ref 31.5–35.7)
MCV: 92 fL (ref 79–97)
MONOCYTES: 8 %
Monocytes Absolute: 0.6 10*3/uL (ref 0.1–0.9)
Neutrophils Absolute: 5.3 10*3/uL (ref 1.4–7.0)
Neutrophils: 62 %
Platelets: 254 10*3/uL (ref 150–450)
RBC: 4.94 x10E6/uL (ref 4.14–5.80)
RDW: 12.4 % (ref 12.3–15.4)
WBC: 8.5 10*3/uL (ref 3.4–10.8)

## 2018-04-25 LAB — VITAMIN D 25 HYDROXY (VIT D DEFICIENCY, FRACTURES): Vit D, 25-Hydroxy: 46 ng/mL (ref 30.0–100.0)

## 2018-04-25 LAB — LIPID PANEL
CHOLESTEROL TOTAL: 189 mg/dL (ref 100–199)
Chol/HDL Ratio: 4.6 ratio (ref 0.0–5.0)
HDL: 41 mg/dL (ref >39)
LDL CALC: 127 mg/dL — AB (ref 0–99)
TRIGLYCERIDES: 105 mg/dL (ref 0–149)
VLDL Cholesterol Cal: 21 mg/dL (ref 5–40)

## 2018-04-25 LAB — COMPREHENSIVE METABOLIC PANEL
ALBUMIN: 4.5 g/dL (ref 3.5–5.5)
ALT: 17 IU/L (ref 0–44)
AST: 17 IU/L (ref 0–40)
Albumin/Globulin Ratio: 1.8 (ref 1.2–2.2)
Alkaline Phosphatase: 53 IU/L (ref 39–117)
BILIRUBIN TOTAL: 0.4 mg/dL (ref 0.0–1.2)
BUN / CREAT RATIO: 24 — AB (ref 9–20)
BUN: 23 mg/dL (ref 6–24)
CO2: 24 mmol/L (ref 20–29)
Calcium: 9.4 mg/dL (ref 8.7–10.2)
Chloride: 102 mmol/L (ref 96–106)
Creatinine, Ser: 0.94 mg/dL (ref 0.76–1.27)
GFR calc Af Amer: 107 mL/min/{1.73_m2} (ref >59)
GFR calc non Af Amer: 93 mL/min/{1.73_m2} (ref >59)
GLUCOSE: 94 mg/dL (ref 65–99)
Globulin, Total: 2.5 g/dL (ref 1.5–4.5)
Potassium: 4.6 mmol/L (ref 3.5–5.2)
Sodium: 140 mmol/L (ref 134–144)
TOTAL PROTEIN: 7 g/dL (ref 6.0–8.5)

## 2018-04-25 LAB — TSH: TSH: 1.23 u[IU]/mL (ref 0.450–4.500)

## 2018-04-25 LAB — T4, FREE: FREE T4: 1.31 ng/dL (ref 0.82–1.77)

## 2018-04-25 LAB — HEMOGLOBIN A1C
Est. average glucose Bld gHb Est-mCnc: 117 mg/dL
Hgb A1c MFr Bld: 5.7 % — ABNORMAL HIGH (ref 4.8–5.6)

## 2018-04-30 ENCOUNTER — Ambulatory Visit (INDEPENDENT_AMBULATORY_CARE_PROVIDER_SITE_OTHER): Payer: BC Managed Care – PPO | Admitting: Family Medicine

## 2018-04-30 ENCOUNTER — Encounter: Payer: Self-pay | Admitting: Family Medicine

## 2018-04-30 VITALS — BP 122/78 | HR 75 | Ht 66.0 in | Wt 200.4 lb

## 2018-04-30 DIAGNOSIS — Z6832 Body mass index (BMI) 32.0-32.9, adult: Secondary | ICD-10-CM

## 2018-04-30 DIAGNOSIS — E66811 Obesity, class 1: Secondary | ICD-10-CM

## 2018-04-30 DIAGNOSIS — R030 Elevated blood-pressure reading, without diagnosis of hypertension: Secondary | ICD-10-CM

## 2018-04-30 DIAGNOSIS — E785 Hyperlipidemia, unspecified: Secondary | ICD-10-CM

## 2018-04-30 DIAGNOSIS — R7303 Prediabetes: Secondary | ICD-10-CM | POA: Diagnosis not present

## 2018-04-30 DIAGNOSIS — E669 Obesity, unspecified: Secondary | ICD-10-CM

## 2018-04-30 NOTE — Progress Notes (Signed)
Assessment and plan:  1. h/o Elevated blood pressure reading   2. Class 1 obesity with body mass index (BMI) of 32.0 to 32.9 in adult, unspecified obesity type, unspecified whether serious comorbidity present   3. Hyperlipidemia, unspecified hyperlipidemia type   4. Prediabetes     1. Reviewed recent lab work (04/24/2018) in depth with patient today.  All lab work within normal limits unless otherwise noted.  Kidney Health - Discussed importance of physical activity and adequate hydration. - Continue to avoid nephrotoxic substances, including ibuprofen and alleve.  Vitamin D = 46 - Discussed goal range as 45-60. - Resume Vitamin D as prescribed.  See med list today. - Take once weekly and daily OTC supplementation.  HbA1c = 5.7 - Counseled patient on prevention of diabetes.  Discussed dietary and lifestyle modifications as first line.  Importance of low carb/ketogenic diet discussed with patient in addition to regular exercise.    Lipid Panel = Elevated LDL Triglycerides = 105 HDL = 41 VLDL = 21 LDL = 127  The 10-year ASCVD risk score Denman George DC Jr., et al., 2013) is: 4.4%   Values used to calculate the score:     Age: 72 years     Sex: Male     Is Non-Hispanic African American: No     Diabetic: No     Tobacco smoker: No     Systolic Blood Pressure: 122 mmHg     Is BP treated: No     HDL Cholesterol: 41 mg/dL     Total Cholesterol: 189 mg/dL  - Dietary changes such as low saturated & trans fat and low carb/ ketogenic diets discussed with patient.  Encouraged regular exercise and weight loss when appropriate.   - Encouraged patient to continue minimizing his alcohol consumption.  Advised patient that he should limit his alcohol intake to 2 drinks per night.  - Educational handouts provided at patient's desire.  BMI Counseling Explained to patient what BMI refers to, and what it means medically.    Told  patient to think about it as a "medical risk stratification measurement" and how increasing BMI is associated with increasing risk/ or worsening state of various diseases such as hypertension, hyperlipidemia, diabetes, premature OA, depression etc.  American Heart Association guidelines for healthy diet, basically Mediterranean diet, and exercise guidelines of 30 minutes 5 days per week or more discussed in detail.  Health counseling performed.  All questions answered.  Lifestyle & Preventative Health Maintenance - Advised patient to continue working toward exercising to improve overall mental, physical, and emotional health.    - Encouraged patient to engage in daily physical activity, especially a formal exercise routine.  Recommended that the patient eventually strive for at least 150 minutes of moderate cardiovascular activity per week according to guidelines established by the [facility].   - Healthy dietary habits encouraged, including low-carb, and high amounts of lean protein in diet.   - Patient should also consume adequate amounts of water.   Education and routine counseling performed. Handouts provided.  Follow-Up - Prescriptions provided and refilled today PRN. - Strongly encouraged patient to return for lab work and chronic follow-up as recommended. - Encouraged patient to keep upcoming appointment with podiatry.  - Otherwise, continue to return for yearly physical exam as scheduled.  - Patient knows to call in sooner if desired to address acute concerns.   No orders of the defined types were placed in this encounter.   No orders  of the defined types were placed in this encounter.   There are no discontinued medications.    Return for (2) great job [employer]!! see u in 6mo for chronic care; sooner if sick.   Anticipatory guidance and routine counseling done re: condition, txmnt options and need for follow up. All questions of patient's were answered.   Gross side effects,  risk and benefits, and alternatives of medications discussed with patient.  Patient is aware that all medications have potential side effects and we are unable to predict every sideeffect or drug-drug interaction that may occur.  Expresses verbal understanding and consents to current therapy plan and treatment regiment.  Please see AVS handed out to patient at the end of our visit for additional patient instructions/ counseling done pertaining to today's office visit.  Note:  This document was prepared using Dragon voice recognition software and may include unintentional dictation errors.    This document serves as a record of services personally performed by Thomasene Lot, DO. It was created on her behalf by Peggye Fothergill, a trained medical scribe. The creation of this record is based on the scribe's personal observations and the provider's statements to them.   I have reviewed the above medical documentation for accuracy and completeness and I concur.  Thomasene Lot 04/30/18 9:57 AM   ----------------------------------------------------------------------------------------------------------------------  Subjective:   CC:   Dale Singh is a 52 y.o. male who presents to [facility] at [facility] today for review and discussion of recent bloodwork that was done in addition to f/up on chronic conditions we are managing for pt.  1. All recent blood work that we ordered was reviewed with patient today.  Patient was counseled on all abnormalities and we discussed dietary and lifestyle changes that could help those values (also medications when appropriate).  Extensive health counseling performed and all patient's concerns/ questions were addressed.  See labs below and also plan for more details of these abnormalities  States he's wound up tight today because he "hates doctors offices."  Says "I've had no luck since I've been in here."  Goes on the 5th to get his feet  looked at.  States that the foot place called him back and talked him in to following up.  States that his wife is doing well, "but she's wound up tight too about what I've had to go through and haven't got one answer yet."  Lately patient has been more active, "up til the issues with the shoulder, and the wrist, and the feet."  Notes he's been drinking less alcohol lately over the past two weeks.  Notes he's been eating the same diet, but has cut out the alcohol until the day before yesterday.  States that sometimes he has 4-5 on the weekends, during a football game.  Patient states he's been drinking about 140 ounces of water per day.    Wt Readings from Last 3 Encounters:  04/30/18 200 lb 6.4 oz (90.9 kg)  04/15/18 198 lb 8 oz (90 kg)  07/08/17 200 lb (90.7 kg)   BP Readings from Last 3 Encounters:  04/30/18 122/78  04/15/18 130/89  07/08/17 129/84   Pulse Readings from Last 3 Encounters:  04/30/18 75  04/15/18 67  07/08/17 86   BMI Readings from Last 3 Encounters:  04/30/18 32.35 kg/m  04/15/18 32.04 kg/m  07/08/17 32.28 kg/m     Patient Care Team    Relationship Specialty Notifications Start End  Thomasene Lot, DO PCP -  General Family Medicine  06/04/17     Full medical history updated and reviewed in the office today  Patient Active Problem List   Diagnosis Date Noted  . Hyperlipidemia 04/15/2018    Priority: High  . Prediabetes 04/15/2018    Priority: High  . Class 1 obesity in adult 07/08/2017    Priority: High  . Elevated blood pressure reading 04/15/2018    Priority: Medium  . Environmental and seasonal allergies 07/08/2017    Priority: Medium  . Vitamin D deficiency 04/15/2018    Priority: Low  . Osteoarthritis 07/08/2017    Priority: Low  . 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 High arches 04/15/2018  . Plantar fasciitis, bilateral 04/15/2018    Past Medical History:  Diagnosis Date  . Allergy   . Arthritis   . Asthma     Past Surgical  History:  Procedure Laterality Date  . NECK SURGERY    . SPINE SURGERY      Social History   Tobacco Use  . Smoking status: Former Games developer  . Smokeless tobacco: Never Used  Substance Use Topics  . Alcohol use: Yes    Family Hx: Family History  Problem Relation Age of Onset  . Diabetes Mother   . Diabetes Father      Medications: Current Outpatient Medications  Medication Sig Dispense Refill  . celecoxib (CELEBREX) 200 MG capsule Take 1 capsule by mouth daily.  1  . Cholecalciferol (VITAMIN D3) 5000 units TABS 5,000 IU OTC vitamin D3 daily. 90 tablet 3  . Vitamin D, Ergocalciferol, (DRISDOL) 50000 units CAPS capsule Take 1 capsule (50,000 Units total) by mouth every 7 (seven) days. 12 capsule 10   No current facility-administered medications for this visit.     Allergies:  No Known Allergies   Review of Systems: General:   No F/C, wt loss Pulm:   No DIB, SOB, pleuritic chest pain Card:  No CP, palpitations Abd:  No n/v/d or pain Ext:  No inc edema from baseline  Objective:  Blood pressure 122/78, pulse 75, height 5\' 6"  (1.676 m), weight 200 lb 6.4 oz (90.9 kg), SpO2 99 %. Body mass index is 32.35 kg/m. Gen:   Well NAD, A and O *3 HEENT:    [place]/AT, EOMI,  MMM Lungs:   Normal work of breathing. CTA B/L, no Wh, rhonchi Heart:   RRR, S1, S2 WNL's, no MRG Abd:   No gross distention Exts:    warm, pink,  Brisk capillary refill, warm and well perfused.  Psych:    No HI/SI, judgement and insight good, Euthymic mood. Full Affect.   Recent Results (from the past 2160 hour(s))  T4, free     Status: None   Collection Time: 04/24/18  9:18 AM  Result Value Ref Range   Free T4 1.31 0.82 - 1.77 ng/dL  VITAMIN D 25 Hydroxy (Vit-D Deficiency, Fractures)     Status: None   Collection Time: 04/24/18  9:18 AM  Result Value Ref Range   Vit D, 25-Hydroxy 46.0 30.0 - 100.0 ng/mL    Comment: Vitamin D deficiency has been defined by the Institute of Medicine and an Endocrine  Society practice guideline as a level of serum 25-OH vitamin D less than 20 ng/mL (1,2). The Endocrine Society went on to further define vitamin D insufficiency as a level between 21 and 29 ng/mL (2). 1. IOM (Institute of Medicine). 2010. Dietary reference    intakes for calcium and D. [place]: The  [employer]. 2. Holick MF, Binkley [place], Bischoff-Ferrari HA, et al.    Evaluation, treatment, and prevention of vitamin D    deficiency: an Endocrine Society clinical practice    guideline. JCEM. 2011 Jul; 96(7):1911-30.   TSH     Status: None   Collection Time: 04/24/18  9:18 AM  Result Value Ref Range   TSH 1.230 0.450 - 4.500 uIU/mL  Lipid panel     Status: Abnormal   Collection Time: 04/24/18  9:18 AM  Result Value Ref Range   Cholesterol, Total 189 100 - 199 mg/dL   Triglycerides 130 0 - 149 mg/dL   HDL 41 >86 mg/dL   VLDL Cholesterol Cal 21 5 - 40 mg/dL   LDL Calculated 578 (H) 0 - 99 mg/dL   Chol/HDL Ratio 4.6 0.0 - 5.0 ratio    Comment:                                   T. Chol/HDL Ratio                                             Men  Women                               1/2 Avg.Risk  3.4    3.3                                   Avg.Risk  5.0    4.4                                2X Avg.Risk  9.6    7.1                                3X Avg.Risk 23.4   11.0   Hemoglobin A1c     Status: Abnormal   Collection Time: 04/24/18  9:18 AM  Result Value Ref Range   Hgb A1c MFr Bld 5.7 (H) 4.8 - 5.6 %    Comment:          Prediabetes: 5.7 - 6.4          Diabetes: >6.4          Glycemic control for adults with diabetes: <7.0    Est. average glucose Bld gHb Est-mCnc 117 mg/dL  Comprehensive metabolic panel     Status: Abnormal   Collection Time: 04/24/18  9:18 AM  Result Value Ref Range   Glucose 94 65 - 99 mg/dL   BUN 23 6 - 24 mg/dL   Creatinine, Ser 4.69 0.76 - 1.27 mg/dL   GFR calc non Af Amer 93 >59 mL/min/1.73   GFR calc Af Amer 107 >59 mL/min/1.73    BUN/Creatinine Ratio 24 (H) 9 - 20   Sodium 140 134 - 144 mmol/L   Potassium 4.6 3.5 - 5.2 mmol/L   Chloride 102 96 - 106 mmol/L   CO2 24 20 - 29 mmol/L   Calcium 9.4 8.7 - 10.2 mg/dL   Total Protein 7.0 6.0 -  8.5 g/dL   Albumin 4.5 3.5 - 5.5 g/dL   Globulin, Total 2.5 1.5 - 4.5 g/dL   Albumin/Globulin Ratio 1.8 1.2 - 2.2   Bilirubin Total 0.4 0.0 - 1.2 mg/dL   Alkaline Phosphatase 53 39 - 117 IU/L   AST 17 0 - 40 IU/L   ALT 17 0 - 44 IU/L  CBC with Differential/Platelet     Status: None   Collection Time: 04/24/18  9:18 AM  Result Value Ref Range   WBC 8.5 3.4 - 10.8 x10E3/uL   RBC 4.94 4.14 - 5.80 x10E6/uL   Hemoglobin 15.2 13.0 - 17.7 g/dL   Hematocrit 45.4 09.8 - 51.0 %   MCV 92 79 - 97 fL   MCH 30.8 26.6 - 33.0 pg   MCHC 33.5 31.5 - 35.7 g/dL   RDW 11.9 14.7 - 82.9 %   Platelets 254 150 - 450 x10E3/uL   Neutrophils 62 Not Estab. %   Lymphs 28 Not Estab. %   Monocytes 8 Not Estab. %   Eos 1 Not Estab. %   Basos 1 Not Estab. %   Neutrophils Absolute 5.3 1.4 - 7.0 x10E3/uL   Lymphocytes Absolute 2.4 0.7 - 3.1 x10E3/uL   Monocytes Absolute 0.6 0.1 - 0.9 x10E3/uL   EOS (ABSOLUTE) 0.1 0.0 - 0.4 x10E3/uL   Basophils Absolute 0.1 0.0 - 0.2 x10E3/uL   Immature Granulocytes 0 Not Estab. %   Immature Grans (Abs) 0.0 0.0 - 0.1 x10E3/uL

## 2018-04-30 NOTE — Patient Instructions (Signed)
Risk factors for prediabetes and type 2 diabetes  Researchers don't fully understand why some people develop prediabetes and type 2 diabetes and others don't.  It's clear that certain factors increase the risk, however, including:  Weight. The more fatty tissue you have, the more resistant your cells become to insulin.  Inactivity. The less active you are, the greater your risk. Physical activity helps you control your weight, uses up glucose as energy and makes your cells more sensitive to insulin.  Family history. Your risk increases if a parent or sibling has type 2 diabetes.  Race. Although it's unclear why, people of certain races - including blacks, Hispanics, American Indians and Asian-Americans - are at higher risk.  Age. Your risk increases as you get older. This may be because you tend to exercise less, lose muscle mass and gain weight as you age. But type 2 diabetes is also increasing dramatically among children, adolescents and younger adults.  Gestational diabetes. If you developed gestational diabetes when you were pregnant, your risk of developing prediabetes and type 2 diabetes later increases. If you gave birth to a baby weighing more than 9 pounds (4 kilograms), you're also at risk of type 2 diabetes.  Polycystic ovary syndrome. For women, having polycystic ovary syndrome - a common condition characterized by irregular menstrual periods, excess hair growth and obesity - increases the risk of diabetes.  High blood pressure. Having blood pressure over 140/90 millimeters of mercury (mm Hg) is linked to an increased risk of type 2 diabetes.  Abnormal cholesterol and triglyceride levels. If you have low levels of high-density lipoprotein (HDL), or "good," cholesterol, your risk of type 2 diabetes is higher. Triglycerides are another type of fat carried in the blood. People with high levels of triglycerides have an increased risk of type 2 diabetes. Your doctor can let you know what your  cholesterol and triglyceride levels are.  A good guide to good carbs: The glycemic index ---If you have diabetes, or at risk for diabetes, you know all too well that when you eat carbohydrates, your blood sugar goes up. The total amount of carbs you consume at a meal or in a snack mostly determines what your blood sugar will do. But the food itself also plays a role. A serving of white rice has almost the same effect as eating pure table sugar - a quick, high spike in blood sugar. A serving of lentils has a slower, smaller effect.  ---Picking good sources of carbs can help you control your blood sugar and your weight. Even if you don't have diabetes, eating healthier carbohydrate-rich foods can help ward off a host of chronic conditions, from heart disease to various cancers to, well, diabetes.  ---One way to choose foods is with the glycemic index (GI). This tool measures how much a food boosts blood sugar.  The glycemic index rates the effect of a specific amount of a food on blood sugar compared with the same amount of pure glucose. A food with a glycemic index of 28 boosts blood sugar only 28% as much as pure glucose. One with a GI of 95 acts like pure glucose.    High glycemic foods result in a quick spike in insulin and blood sugar (also known as blood glucose).  Low glycemic foods have a slower, smaller effect- these are healthier for you.   Using the glycemic index Using the glycemic index is easy: choose foods in the low GI category instead of those in the high  GI category (see below), and go easy on those in between. Low glycemic index (GI of 55 or less): Most fruits and vegetables, beans, minimally processed grains, pasta, low-fat dairy foods, and nuts.  Moderate glycemic index (GI 56 to 69): White and sweet potatoes, corn, white rice, couscous, breakfast cereals such as Cream of Wheat and Mini Wheats.  High glycemic index (GI of 70 or higher): White bread, rice cakes, most crackers,  bagels, cakes, doughnuts, croissants, most packaged breakfast cereals. You can see the values for 100 commons foods and get links to more at www.[email or web address].si.  Swaps for lowering glycemic index  Instead of this high-glycemic index food Eat this lower-glycemic index food  White rice Brown rice or converted rice  Instant oatmeal Steel-cut oats  Cornflakes Bran flakes  Baked potato Pasta, bulgur  White bread Whole-grain bread  Corn Peas or leafy greens       Prediabetes Eating Plan  Prediabetes--also called impaired glucose tolerance or impaired fasting glucose--is a condition that causes blood sugar (blood glucose) levels to be higher than normal. Following a healthy diet can help to keep prediabetes under control. It can also help to lower the risk of type 2 diabetes and heart disease, which are increased in people who have prediabetes. Along with regular exercise, a healthy diet:  Promotes weight loss.  Helps to control blood sugar levels.  Helps to improve the way that the body uses insulin.   WHAT DO I NEED TO KNOW ABOUT THIS EATING PLAN?   Use the glycemic index (GI) to plan your meals. The index tells you how quickly a food will raise your blood sugar. Choose low-GI foods. These foods take a longer time to raise blood sugar.  Pay close attention to the amount of carbohydrates in the food that you eat. Carbohydrates increase blood sugar levels.  Keep track of how many calories you take in. Eating the right amount of calories will help you to achieve a healthy weight. Losing about 7 percent of your starting weight can help to prevent type 2 diabetes.  You may want to follow a Mediterranean diet. This diet includes a lot of vegetables, lean meats or fish, whole grains, fruits, and healthy oils and fats.   WHAT FOODS CAN I EAT?  Grains Whole grains, such as whole-wheat or whole-grain breads, crackers, cereals, and pasta. Unsweetened oatmeal. Bulgur. Barley.  Quinoa. Brown rice. Corn or whole-wheat flour tortillas or taco shells. Vegetables Lettuce. Spinach. Peas. Beets. Cauliflower. Cabbage. Broccoli. Carrots. Tomatoes. Squash. Eggplant. Herbs. Peppers. Onions. Cucumbers. Brussels sprouts. Fruits Berries. Bananas. Apples. Oranges. Grapes. Papaya. Mango. Pomegranate. Kiwi. Grapefruit. Cherries. Meats and Other Protein Sources Seafood. Lean meats, such as chicken and [place] or lean cuts of pork and beef. Tofu. Eggs. Nuts. Beans. Dairy Low-fat or fat-free dairy products, such as yogurt, cottage cheese, and cheese. Beverages Water. Tea. Coffee. Sugar-free or diet soda. Seltzer water. Milk. Milk alternatives, such as soy or almond milk. Condiments Mustard. Relish. Low-fat, low-sugar ketchup. Low-fat, low-sugar barbecue sauce. Low-fat or fat-free mayonnaise. Sweets and Desserts Sugar-free or low-fat pudding. Sugar-free or low-fat ice cream and other frozen treats. Fats and Oils Avocado. Walnuts. Olive oil. The items listed above may not be a complete list of recommended foods or beverages. Contact your dietitian for more options.    WHAT FOODS ARE NOT RECOMMENDED?  Grains Refined white flour and flour products, such as bread, pasta, snack foods, and cereals. Beverages Sweetened drinks, such as sweet iced tea and soda. Sweets and Desserts  Baked goods, such as cake, cupcakes, pastries, cookies, and cheesecake. The items listed above may not be a complete list of foods and beverages to avoid. Contact your dietitian for more information.   This information is not intended to replace advice given to you by your health care provider. Make sure you discuss any questions you have with your health care provider.   Document Released: 11/01/2014 Document Reviewed: 11/01/2014 Elsevier Interactive Patient Education [employer]       The quick and dirty--> lower triglyceride levels more through...  1) - Beware of bad fats: Cutting back on  saturated fat (in red meat and full-fat dairy foods) and trans fats (in restaurant fried foods and commercially prepared baked goods) can lower triglycerides.  2) - Go for good carbs: Easily digested carbohydrates (such as white bread, white rice, cornflakes, and sugary sodas) give triglycerides a definite boost.   3) - Eating whole grains and cutting back on soda can help control triglycerides.  4) - Check your alcohol use. In some people, alcohol dramatically boosts triglycerides. The only way to know if this is true for you is to avoid alcohol for a few weeks and have your triglycerides tested again.  5) - Go fish. Omega-3 fats in salmon, tuna, sardines, and other fatty fish can lower triglycerides. Having fish twice a week is fine.  6) - Aim for a healthy weight. If you are overweight, losing just 5% to 10% of your weight can help drive down triglycerides.  7) - Get moving. Exercise lowers triglycerides and boosts heart-healthy HDL cholesterol.  8) - quit smoking if you do  --> for more information, see below; or go to  www.heart.org  and do a search for desired topics   For those diagnosed with high triglycerides, it's important to take action to lower your levels and improve your heart health.  Triglyceride is just a fancy word for fat - the fat in our bodies is stored in the form of triglycerides. Triglycerides are found in foods and manufactured in our bodies.  Normal triglyceride levels are defined as less than 150 mg/dL; 161 to 096 is considered borderline high; 200 to 499 is high; and 500 or higher is officially called very high. To me, anything over 150 is a red flag indicating my patient needs to take immediate steps to get the situation under control.   What is the significance of high triglycerides? High triglyceride levels make blood thicker and stickier, which means that it is more likely to form clots. Studies have shown that triglyceride levels are associated with  increased risks of cardiovascular disease and stroke - in both men and women - alone or in combination with other risk factors (high triglycerides combined with high LDL cholesterol can be a particularly deadly combination). For example, in one ground-breaking study, high triglycerides alone increased the risk of cardiovascular disease by 14 percent in men, and by 37 percent in women. But when the test subjects also had low HDL cholesterol (that's the good cholesterol) and other risk factors, high triglycerides increased the risk of disease by 32 percent in men and 76 percent in women.   Fortunately, triglycerides can sometimes be controlled with several diet and lifestyle changes.    What Factors Can Increase Triglycerides? As with cholesterol, eating too much of the wrong kinds of fats will raise your blood triglycerides.  Therefore, it's important to restrict the amounts of saturated fats and trans fats you allow into your diet.  Triglyceride levels can also shoot up after eating foods that are high in carbohydrates or after drinking alcohol.  That's why triglyceride blood tests require an overnight fast.  If you have elevated triglycerides, it's especially important to avoid sugary and refined carbohydrates, including sugar, honey, and other sweeteners, soda and other sugary drinks, candy, baked goods, and anything made with white (refined or enriched) flour, including white bread, rolls, cereals, buns, pastries, regular pasta, and white rice.  You'll also want to limit dried fruit and fruit juice since they're dense in simple sugar.  All of these low-quality carbs cause a sudden rise in insulin, which may lead to a spike in triglycerides.  Triglycerides can also become elevated as a reaction to having diabetes, hypothyroidism, or kidney disease. As with most other heart-related factors, being overweight and inactive also contribute to abnormal triglycerides. And unfortunately, some people have a genetic  predisposition that causes them to manufacture way too much triglycerides on their own, no matter how carefully they eat.     How Can You Lower Your Triglyceride Levels? If you are diagnosed with high triglycerides, it's important to take action. There are several things you can do to help lower your triglyceride levels and improve your heart health:  --> Lose weight if you are overweight.  There is a clear correlation between obesity and high triglycerides - the heavier people are, the higher their triglyceride levels are likely to be. The good news is that losing weight can significantly lower triglycerides. In a large study of individuals with type 2 diabetes, those assigned to the "lifestyle intervention group" - which involved counseling, a low-calorie meal plan, and customized exercise program - lost 8.6% of their body weight and lowered their triglyceride levels by more than 16%. If you're overweight, find a weight loss plan that works for you and commit to shedding the pounds and getting healthier.  --> Reduce the amount of saturated fat and trans fat in your diet.  Start by avoiding or dramatically limiting butter, cream cheese, lard, sour cream, doughnuts, cakes, cookies, candy bars, regular ice cream, fried foods, pizza, cheese sauce, cream-based sauces and salad dressings, high-fat meats (including fatty hamburgers, bologna, pepperoni, sausage, bacon, salami, pastrami, spareribs, and hot dogs), high-fat cuts of beef and pork, and whole-milk dairy products.   Other ways to cut back: Choose lean meats only (including skinless chicken and [place], lean beef, lean pork), fish, and reduced-fat or fat-free dairy products.   Experiment with adding whole soy foods to your diet. Although soy itself may not reduce risk of heart disease, it replaces hazardous animal fats with healthier proteins. Choose high-quality soy foods, such as tofu, tempeh, soy milk, and edamame (whole soybeans).  Always  remove skin from poultry.  Prepare foods by baking, roasting, broiling, boiling, poaching, steaming, grilling, or stir-frying in vegetable oil.  Most stick margarines contain trans fats, and trans fats are also found in some packaged baked goods, potato chips, snack foods, fried foods, and fast food that use or create hydrogenated oils.    (All food labels must now list the amount of trans fats, right after the amount of saturated fats - good news for consumers. As a result, many food companies have now reformulated their products to be trans fat free.many, but not all! So it's still just as important to read labels and make sure the packaged foods you buy don't contain trans fats.)     If you use margarine, purchase soft-tub margarine spreads that contain  0 grams trans fats and don't list any partially hydrogenated oils in the ingredients list. By substituting olive oil or vegetable oil for trans fats in just 2 percent of your daily calories, you can reduce your risk of heart disease by 53 percent.   There is no safe amount of trans fats, so try to keep them as far from your plate as possible.  -->  Avoid foods that are concentrated in sugar (even dried fruit and fruit juice). Sugary foods can elevate triglyceride levels in the blood, so keep them to a bare minimum.  --> Swap out refined carbohydrates for whole grains.  Refined carbohydrates - like white rice, regular pasta, and anything made with white or "enriched" flour (including white bread, rolls, cereals, buns, and crackers) - raise blood sugar and insulin levels more than fiber-rich whole grains. Higher insulin levels, in turn, can lead to a higher rise in triglycerides after a meal. So, make the switch to whole wheat bread, whole grain pasta, brown or wild rice, and whole grain versions of cereals, crackers, and other bread products. However, it's important to know that individuals with high triglycerides should moderate even their intake of  high-quality starches (since all starches raise blood sugar) - I recommend 1 to 2 servings per meal.  --> Cut way back on alcohol.  If you have high triglycerides, alcohol should be considered a rare treat - if you indulge at all, since even small amounts of alcohol can dramatically increase triglyceride levels.  --> Incorporate omega-3 fats.  Heart-healthy fish oils are especially rich in omega-3 fatty acids. In multiple studies over the past two decades, people who ate diets high in omega-3s had 30 to 40 percent reductions in heart disease. Although we don't yet know why fish oil works so well, there are several possibilities. Omega-3s seem to reduce inflammation, reduce high blood pressure, decrease triglycerides, raise HDL cholesterol, and make blood thinner and less sticky so it is less likely to clot. It's as close to a food prescription for heart health as it gets. If you have high triglycerides, I recommend eating at least three servings of one of the omega-3-rich fish every week (fatty fish is the most concentrated food form of omega three fats). If you cannot manage to eat that much fish, speak with your physician about taking fish oil capsules, which offer similar benefits.The best foods for omega-3 fatty acids include wild salmon (fresh, canned), herring, mackerel (not king), sardines, anchovies, rainbow trout, and Pacific oysters. Non-fish sources of omega-3 fats include omega-3-fortified eggs, ground flaxseed, chia seeds, walnuts, butternuts (white walnuts), seaweed, walnut oil, canola oil, and soybeans.  --> Quit smoking.  Smoking causes inflammation, not just in your lungs, but throughout your body. Inflammation can contribute to atherosclerosis, blood clots, and risk of heart attack. Smoking makes all heart health indicators worse. If you have high cholesterol, high triglycerides, or high blood pressure, smoking magnifies the danger.  --> Become more physically active.  Even moderate  exercise can help improve cholesterol, triglycerides, and blood pressure. Aerobic exercise seems to be able to stop the sharp rise of triglycerides after eating, perhaps because of a decrease in the amount of triglyceride released by the liver, or because active muscle clears triglycerides out of the blood stream more quickly than inactive muscle. If you haven't exercised regularly (or at all) for years, I recommend starting slowly, by walking at an easy pace for 15 minutes a day. Then, as you feel more comfortable, increase the  amount. Your ultimate goal should be at least 30 minutes of moderate physical activity, at least five days a week.   Guidelines for a Low Cholesterol, Low Saturated Fat Diet   Fats - Limit total intake of fats and oils. - Avoid butter, stick margarine, shortening, lard, palm and coconut oils. - Limit mayonnaise, salad dressings, gravies and sauces, unless they are homemade with low-fat ingredients. - Limit chocolate. - Choose low-fat and nonfat products, such as low-fat mayonnaise, low-fat or non-hydrogenated peanut butter, low-fat or fat-free salad dressings and nonfat gravy. - Use vegetable oil, such as canola or olive oil. - Look for margarine that does not contain trans fatty acids. - Use nuts in moderate amounts. - Read ingredient labels carefully to determine both amount and type of fat present in foods. Limit saturated and trans fats! - Avoid high-fat processed and convenience foods.  Meats and Meat Alternatives - Choose fish, chicken, [place] and lean meats. - Use dried beans, peas, lentils and tofu. - Limit egg yolks to three to four per week. - If you eat red meat, limit to no more than three servings per week and choose loin or round cuts. - Avoid fatty meats, such as bacon, sausage, franks, luncheon meats and ribs. - Avoid all organ meats, including liver.  Dairy - Choose nonfat or low-fat milk, yogurt and cottage cheese. - Most cheeses are high in fat.  Choose cheeses made from non-fat milk, such as mozzarella and ricotta cheese. - Choose light or fat-free cream cheese and sour cream. - Avoid cream and sauces made with cream.  Fruits and Vegetables - Eat a wide variety of fruits and vegetables. - Use lemon juice, vinegar or "mist" olive oil on vegetables. - Avoid adding sauces, fat or oil to vegetables.  Breads, Cereals and Grains - Choose whole-grain breads, cereals, pastas and rice. - Avoid high-fat snack foods, such as granola, cookies, pies, pastries, doughnuts and croissants.  Cooking Tips - Avoid deep fried foods. - Trim visible fat off meats and remove skin from poultry before cooking. - Bake, broil, boil, poach or roast poultry, fish and lean meats. - Drain and discard fat that drains out of meat as you cook it. - Add little or no fat to foods. - Use vegetable oil sprays to grease pans for cooking or baking. - Steam vegetables. - Use herbs or no-oil marinades to flavor foods.

## 2018-05-05 ENCOUNTER — Ambulatory Visit: Payer: BC Managed Care – PPO | Admitting: Podiatry

## 2018-05-05 ENCOUNTER — Encounter: Payer: Self-pay | Admitting: Podiatry

## 2018-05-05 ENCOUNTER — Ambulatory Visit (INDEPENDENT_AMBULATORY_CARE_PROVIDER_SITE_OTHER): Payer: BC Managed Care – PPO

## 2018-05-05 ENCOUNTER — Other Ambulatory Visit: Payer: Self-pay | Admitting: Podiatry

## 2018-05-05 VITALS — BP 136/97 | HR 77 | Resp 16

## 2018-05-05 DIAGNOSIS — G5782 Other specified mononeuropathies of left lower limb: Secondary | ICD-10-CM

## 2018-05-05 DIAGNOSIS — M779 Enthesopathy, unspecified: Principal | ICD-10-CM

## 2018-05-05 DIAGNOSIS — M778 Other enthesopathies, not elsewhere classified: Secondary | ICD-10-CM

## 2018-05-05 DIAGNOSIS — M7661 Achilles tendinitis, right leg: Secondary | ICD-10-CM | POA: Diagnosis not present

## 2018-05-05 DIAGNOSIS — G5761 Lesion of plantar nerve, right lower limb: Secondary | ICD-10-CM | POA: Diagnosis not present

## 2018-05-05 DIAGNOSIS — G5762 Lesion of plantar nerve, left lower limb: Secondary | ICD-10-CM

## 2018-05-05 DIAGNOSIS — M19071 Primary osteoarthritis, right ankle and foot: Secondary | ICD-10-CM | POA: Diagnosis not present

## 2018-05-05 DIAGNOSIS — M7662 Achilles tendinitis, left leg: Secondary | ICD-10-CM | POA: Diagnosis not present

## 2018-05-05 DIAGNOSIS — M722 Plantar fascial fibromatosis: Secondary | ICD-10-CM

## 2018-05-05 MED ORDER — METHYLPREDNISOLONE 4 MG PO TBPK: ORAL_TABLET | ORAL | 0 refills | Status: DC

## 2018-05-05 MED ORDER — CELECOXIB 200 MG PO CAPS: 200.0000 mg | ORAL_CAPSULE | Freq: Every day | ORAL | 1 refills | Status: DC

## 2018-05-05 NOTE — Progress Notes (Signed)
Subjective:  Patient ID: Dale Singh, male    DOB: [birth year],  MRN: [id number] HPI Chief Complaint  Patient presents with  . Foot Pain    Achilles right - area swollen, numb and tender x 2 months, no injury  . Toe Pain    Hallux right - redness few weeks ago, started suddenly, got steriod injection in hip and got better  . Foot Pain    Plantar forefoot left - 3rd and 4th toes separating, feels knot when walking  . NOTE    Wears heel lift on left side for limb length difference  . New Patient (Initial Visit)    52 y.o. male presents with the above complaint.   ROS: Denies fever chills nausea vomiting muscle aches pains calf pain back pain chest pain shortness of breath.  He is an Personnel officer.  Past Medical History:  Diagnosis Date  . Allergy   . Arthritis   . Asthma    Past Surgical History:  Procedure Laterality Date  . NECK SURGERY    . SPINE SURGERY      Current Outpatient Medications:  .  celecoxib (CELEBREX) 200 MG capsule, Take 1 capsule (200 mg total) by mouth daily., Disp: 30 capsule, Rfl: 1 .  Cholecalciferol (VITAMIN D3) 5000 units TABS, 5,000 IU OTC vitamin D3 daily., Disp: 90 tablet, Rfl: 3 .  methylPREDNISolone (MEDROL DOSEPAK) 4 MG TBPK tablet, 6 day dose pack - take as directed, Disp: 21 tablet, Rfl: 0 .  Vitamin D, Ergocalciferol, (DRISDOL) 50000 units CAPS capsule, Take 1 capsule (50,000 Units total) by mouth every 7 (seven) days., Disp: 12 capsule, Rfl: 10  No Known Allergies Review of Systems Objective:   Vitals:   05/05/18 0844  BP: (!) 136/97  Pulse: 77  Resp: 16    General: Well developed, nourished, in no acute distress, alert and oriented x3   Dermatological: Skin is warm, dry and supple bilateral. Nails x 10 are well maintained; remaining integument appears unremarkable at this time. There are no open sores, no preulcerative lesions, no rash or signs of infection present.  Vascular: Dorsalis Pedis artery and Posterior Tibial artery  pedal pulses are 2/4 bilateral with immedate capillary fill time. Pedal hair growth present. No varicosities and no lower extremity edema present bilateral.   Neruologic: Grossly intact via light touch bilateral. Vibratory intact via tuning fork bilateral. Protective threshold with Semmes Wienstein monofilament intact to all pedal sites bilateral. Patellar and Achilles deep tendon reflexes 2+ bilateral. No Babinski or clonus noted bilateral.  Palpable Mulder's click third interdigital space left foot.  Musculoskeletal: No gross boney pedal deformities bilateral. No pain, crepitus, or limitation noted with foot and ankle range of motion bilateral. Muscular strength 5/5 in all groups tested bilateral.  He has a nodular mass on the Achilles proximal to his insertion site.  Approximately the watershed area.  It is minimally tender on palpation he has good full dorsiflexion negative Thompson's test.  Margins appear to be normal all the way down.  Gait: Unassisted, Nonantalgic.    Radiographs:  Radiographs taken today demonstrate an osseously mature individual with thickening of the Achilles tendon to the right lower leg.  Minimal spurring of the retrocalcaneal heel.  Left foot demonstrates diastases between the third and fourth toes.  Assessment & Plan:   Assessment: Neuroma third interdigital space left.  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    Plan: Discussed etiology pathology conservative surgical therapies at this  point we discussed precautions regarding the right Achilles.  I injected the third interdigital space of the left foot 20 mg Kenalog 5 mg Marcaine discussed appropriate shoe gear stretching exercise ice therapy sugar modifications.  I also recommended orthotics.  He was casted today.     Laveda Demedeiros T. [place], [place]

## 2018-05-05 NOTE — Progress Notes (Signed)
Orthotic note: 1st met head cut out R, offload 1st mpj R, 1.5 mm lift R (achilles tend.), 3/4 offload L w/ proximal neuroma pad L, 4.5 mm lift L for 74m leg length dis, sulcus length, vinyl over 1.5 mm poron.  RLiliane Channel

## 2018-05-26 ENCOUNTER — Encounter: Payer: Self-pay | Admitting: Podiatry

## 2018-05-26 ENCOUNTER — Ambulatory Visit: Payer: BC Managed Care – PPO | Admitting: Orthotics

## 2018-05-26 ENCOUNTER — Ambulatory Visit: Payer: BC Managed Care – PPO | Admitting: Podiatry

## 2018-05-26 DIAGNOSIS — G5782 Other specified mononeuropathies of left lower limb: Secondary | ICD-10-CM

## 2018-05-26 DIAGNOSIS — G5762 Lesion of plantar nerve, left lower limb: Secondary | ICD-10-CM | POA: Diagnosis not present

## 2018-05-26 DIAGNOSIS — M7661 Achilles tendinitis, right leg: Secondary | ICD-10-CM

## 2018-05-26 NOTE — Progress Notes (Signed)
He presents today to pick up his orthotics for his neuroma on his left foot Achilles on the right foot.  States that he is doing better after the injections but he is not sure about these orthotics after trying that.  Objective: No change in physical exam on bilateral foot.  Pulses remain palpable.  Assessment: Nodule of the Achilles tendon right.  Neuroma third interdigital space left.  Plan: Encouraged him to try the orthotics notify Raiford NobleRick if need to be changed.

## 2018-05-26 NOTE — Progress Notes (Signed)
Patient came in today to pick up custom made foot orthotics.  The goals were accomplished and the patient reported no dissatisfaction with said orthotics.  Patient was advised of breakin period and how to report any issues. 

## 2018-06-04 ENCOUNTER — Ambulatory Visit: Payer: BC Managed Care – PPO | Admitting: Orthotics

## 2018-06-04 DIAGNOSIS — G5762 Lesion of plantar nerve, left lower limb: Secondary | ICD-10-CM

## 2018-06-04 DIAGNOSIS — G5782 Other specified mononeuropathies of left lower limb: Secondary | ICD-10-CM

## 2018-06-04 DIAGNOSIS — M19071 Primary osteoarthritis, right ankle and foot: Secondary | ICD-10-CM

## 2018-06-04 DIAGNOSIS — M7661 Achilles tendinitis, right leg: Secondary | ICD-10-CM

## 2018-06-04 NOTE — Progress Notes (Signed)
Patient f/o lift to address AT, put too much pressure on forefoot neuroma; made ppt offload and gave full length lift to try in shoe.

## 2018-07-26 ENCOUNTER — Other Ambulatory Visit: Payer: Self-pay | Admitting: Family Medicine

## 2018-07-26 DIAGNOSIS — E559 Vitamin D deficiency, unspecified: Secondary | ICD-10-CM

## 2018-08-10 ENCOUNTER — Other Ambulatory Visit: Payer: Self-pay | Admitting: Podiatry

## 2018-10-07 ENCOUNTER — Other Ambulatory Visit: Payer: Self-pay | Admitting: Podiatry

## 2018-10-29 ENCOUNTER — Ambulatory Visit (INDEPENDENT_AMBULATORY_CARE_PROVIDER_SITE_OTHER): Payer: BC Managed Care – PPO | Admitting: Family Medicine

## 2018-10-29 ENCOUNTER — Encounter: Payer: Self-pay | Admitting: Family Medicine

## 2018-10-29 ENCOUNTER — Other Ambulatory Visit: Payer: Self-pay

## 2018-10-29 VITALS — Ht 66.0 in | Wt 190.0 lb

## 2018-10-29 DIAGNOSIS — E785 Hyperlipidemia, unspecified: Secondary | ICD-10-CM

## 2018-10-29 DIAGNOSIS — Z6832 Body mass index (BMI) 32.0-32.9, adult: Secondary | ICD-10-CM

## 2018-10-29 DIAGNOSIS — E669 Obesity, unspecified: Secondary | ICD-10-CM | POA: Diagnosis not present

## 2018-10-29 DIAGNOSIS — R7303 Prediabetes: Secondary | ICD-10-CM | POA: Diagnosis not present

## 2018-10-29 DIAGNOSIS — R030 Elevated blood-pressure reading, without diagnosis of hypertension: Secondary | ICD-10-CM | POA: Diagnosis not present

## 2018-10-29 NOTE — Progress Notes (Signed)
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   . Location of the patient: Home . Location of the provider: Office Only the patient (+/- their family members at pt's discretion) and myself were participating in the encounter    - This visit type was conducted due to national recommendations for restrictions regarding the COVID-19 Pandemic (e.g. social distancing) in an effort to limit this patient's exposure and mitigate transmission in our community.  This format is felt to be most appropriate for this patient at this time.   - The patient did not have access to video technology or had technical difficulties with video requiring transitioning to audio format only. - No physical exam could be performed with this format, beyond that communicated to [place] by the patient/ family members as noted.   - Additionally my office staff/ schedulers discussed with the patient that there may be a monetary charge related to this service, depending on their medical insurance.   The patient expressed understanding, and agreed to proceed.       History of Present Illness:  - Gen OA: Quit taking celebrex that another doc gave him- now his able "to move better, jts quit hurting so much".  He is able to sleep better even. - Pt working full-time.   - H/o elevated BP--->  BP at home.   Best BP we got from last OV  - Pre-DM & Obese:  pt wt is down 10 lbs.  watching what he is eating better, cutting portions  - HLD:  Losing wt and eating better.  ASCVD risk was under threshold for need for medicines.  Since his blood pressure is well controlled, no diabetes and no smoking, it was just diet and lifestyle modification.  -   Last OV Plan notes: 1. Reviewed recent lab work (04/24/2018) in depth with patient today.  All lab work within normal limits  unless otherwise noted.  Kidney Health - Discussed importance of physical activity and adequate hydration. - Continue to avoid nephrotoxic substances, including ibuprofen and alleve.  Vitamin D = 46 - Discussed goal range as 45-60. - Resume Vitamin D as prescribed.  See med list today. - Take once weekly and daily OTC supplementation.  HbA1c = 5.7 - Counseled patient on prevention of diabetes.  Discussed dietary and lifestyle modifications as first line.  Importance of low carb/ketogenic diet discussed with patient in addition to regular exercise.    Lipid Panel = Elevated LDL Triglycerides = 105 HDL = 41 VLDL = 21 LDL = 127  The 10-year ASCVD risk score Denman George DC Jr., et al., 2013) is: 4.4%   Values used to calculate the score:     Age: 53 years     Sex: Male     Is Non-Hispanic African American: No     Diabetic: No     Tobacco smoker: No     Systolic Blood Pressure: 122 mmHg     Is BP treated: No     HDL Cholesterol: 41 mg/dL     Total Cholesterol: 189 mg/dL  - Dietary changes such as low saturated & trans fat and low carb/ ketogenic diets discussed with patient.  Encouraged regular exercise and weight loss when appropriate.   - Encouraged patient to continue minimizing his alcohol consumption.  Advised patient that he  should limit his alcohol intake to 2 drinks per night.     Impression and Recommendations:     1. Prediabetes Well-controlled 5.7 when last checked 5.9 a year and a half ago.  Will check yearly. -Education done with patient and encouraged to continue weight loss.  2. Class 1 obesity with body mass index (BMI) of 32.0 to 32.9 in adult, unspecified obesity type, unspecified whether serious comorbidity present Patient has lost 10 pounds.  Congratulated.  Doing great. -Continue calorie restriction, better choices, cut back on alcohol, try to move more  3. Hyperlipidemia, unspecified hyperlipidemia type We will check yearly.  He is due end of  October for recheck.  4. Elevated blood pressure reading Patient's blood pressure had been elevated in the past and patient was asked to check it. -Patient tells me he has no way to check and he tells me he cannot get a monitor. -Educated patient on importance of keeping well controlled blood pressure to decrease risk heart attack, stroke, kidney disease etc.  -CPE end of October with fasting blood work  - As part of my medical decision making, I reviewed the following data within the electronic MEDICAL RECORD NUMBER History obtained from pt /family, CMA notes reviewed and incorporated if applicable, Labs reviewed, Radiograph/ tests reviewed if applicable and OV notes from prior OV's with me, as well as other specialists she/he has seen since seeing me last, were all reviewed and used in my medical decision making process today.   - Additionally, discussion had with patient regarding txmnt plan, and their biases/concerns about that plan were used in my medical decision making today.   - The patient agreed with the plan and demonstrated an understanding of the instructions.   No barriers to understanding were identified.   - Red flag symptoms and signs discussed in detail.  Patient expressed understanding regarding what to do in case of emergency\ urgent symptoms.  The patient was advised to call back or seek an in-person evaluation if the symptoms worsen or if the condition fails to improve as anticipated.   Return for CPE with same day FBW end of October; sooner if issues.    I provided 12+ minutes of non-face-to-face time during this encounter,with over 50% of the time in direct counseling on patients medical conditions/ medical concerns.  Additional time was spent with charting and coordination of care after the actual visit commenced.   Note:  This note was prepared with assistance of Dragon voice recognition software. Occasional wrong-word or sound-a-like substitutions may have occurred due to  the inherent limitations of voice recognition software.  Dale Lot, DO    -Vitals obtained; medications/ allergies reconciled;  personal medical, social, Sx etc.histories were updated by CMA, reviewed by me and are reflected in chart   Patient Care Team    Relationship Specialty Notifications Start End  Dale Lot, DO PCP - General Family Medicine  06/04/17      Patient Active Problem List   Diagnosis Date Noted  . Hyperlipidemia 04/15/2018    Priority: High  . Prediabetes 04/15/2018    Priority: High  . Class 1 obesity in adult 07/08/2017    Priority: High  . Elevated blood pressure reading 04/15/2018    Priority: Medium  . Environmental and seasonal allergies 07/08/2017    Priority: Medium  . Vitamin D deficiency 04/15/2018    Priority: Low  . Osteoarthritis 07/08/2017    Priority: Low  . 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 High arches 04/15/2018  .  Plantar fasciitis, bilateral 04/15/2018     Current Meds  Medication Sig  . Cholecalciferol (VITAMIN D3) 5000 units TABS 5,000 IU OTC vitamin D3 daily.  . Vitamin D, Ergocalciferol, (DRISDOL) 1.25 MG (50000 UT) CAPS capsule TAKE 1 CAPSULE (50,000 UNITS TOTAL) BY MOUTH EVERY 7 (SEVEN) DAYS.     Allergies:  No Known Allergies   ROS:  See above HPI for pertinent positives and negatives   Objective:   Height 5\' 6"  (1.676 m), weight 190 lb (86.2 kg).  (if some vitals are omitted, this means that patient was UNABLE to obtain them even though they were asked to get them prior to OV today.  They were asked to call us at their earliest convenience with these once obtained. )  General: A & O * 3; sounds in no acute distress; in usual state of health.  Skin: Pt confirms warm and dry extremities and pink fingertips HEENT: Pt confirms lips non-cyanotic Chest: Patient confirms normal chest excursion and movement Respiratory: speaking in full sentences, no conversational dyspnea; patient confirms no use of  accessory muscles Psych: insight appears good, mood- appears full

## 2018-11-28 ENCOUNTER — Other Ambulatory Visit: Payer: Self-pay | Admitting: Podiatry

## 2019-09-30 ENCOUNTER — Other Ambulatory Visit: Payer: Self-pay | Admitting: Family Medicine

## 2019-09-30 DIAGNOSIS — E559 Vitamin D deficiency, unspecified: Secondary | ICD-10-CM

## 2019-11-03 ENCOUNTER — Other Ambulatory Visit: Payer: Self-pay | Admitting: Family Medicine

## 2019-11-03 ENCOUNTER — Telehealth: Payer: Self-pay

## 2019-11-03 DIAGNOSIS — E559 Vitamin D deficiency, unspecified: Secondary | ICD-10-CM

## 2019-11-03 NOTE — Telephone Encounter (Signed)
Please call pt to schedule FBW appt.  No further refills on Vitamin D until pt has labs completed.  Tiajuana Amass, CMA

## 2019-11-04 ENCOUNTER — Telehealth: Payer: Self-pay | Admitting: General Practice

## 2019-11-04 NOTE — Telephone Encounter (Signed)
Called pt to schedule for necessary lab appt--Per patient he is not coming back to [facility] --Removing provider as PCP.  Note   Please call pt to schedule FBW appt.  No further refills on Vitamin D until pt has labs completed.  Tiajuana Amass, CMA      --FYI to med asst &  Not to worry about Rx refills

## 2019-11-05 NOTE — Telephone Encounter (Signed)
Our provider removed as PCP. AS, CMA

## 2019-11-23 ENCOUNTER — Telehealth: Payer: Self-pay | Admitting: General Practice

## 2019-12-07 ENCOUNTER — Telehealth: Payer: Self-pay | Admitting: General Practice

## 2019-12-07 NOTE — Telephone Encounter (Signed)
Message to med asst that pt is no longer at [facility]--removed provider's contact info.  --glh

## 2020-01-14 ENCOUNTER — Other Ambulatory Visit: Payer: Self-pay

## 2020-01-14 ENCOUNTER — Encounter: Payer: Self-pay | Admitting: Podiatry

## 2020-01-14 ENCOUNTER — Ambulatory Visit (INDEPENDENT_AMBULATORY_CARE_PROVIDER_SITE_OTHER): Payer: BC Managed Care – PPO | Admitting: Podiatry

## 2020-01-14 DIAGNOSIS — G5762 Lesion of plantar nerve, left lower limb: Secondary | ICD-10-CM

## 2020-01-14 DIAGNOSIS — G5782 Other specified mononeuropathies of left lower limb: Secondary | ICD-10-CM

## 2020-01-14 NOTE — Progress Notes (Signed)
He presents today for follow-up of neuroma third interdigital space of the left foot from 2019.  He received orthotics and states that he was able to unable to wear them after they had been incorrectly made with modifications on the wrong foot.  He also had them "touched up" which resulted in a worse fitting orthotic.  He states that currently he has developed a clicking sensation rather than the electrical shocks that he was experiencing through the distal aspect of the toe.  Objective: Vital signs are stable he is alert oriented x3.  Palpable Mulder's click is noted to the third interdigital space.  Radiating pain is noted as well.  Assessment: Neuroma third interdigital space left.  Plan: At this point I injected 10 mg of Kenalog 5 mg of Marcaine to the point of maximal tenderness.  We will fabricate him a brand-new pair of orthotics.  This time we will not add any metatarsal pads.  There will be no modifications made to old orthotics we are requesting a new pair either from the lab or at no charge depending on whose error it was.

## 2020-01-27 ENCOUNTER — Ambulatory Visit: Payer: BC Managed Care – PPO | Admitting: Orthotics

## 2020-01-27 ENCOUNTER — Other Ambulatory Visit: Payer: Self-pay

## 2020-01-27 DIAGNOSIS — M7661 Achilles tendinitis, right leg: Secondary | ICD-10-CM

## 2020-01-27 DIAGNOSIS — G5762 Lesion of plantar nerve, left lower limb: Secondary | ICD-10-CM

## 2020-01-27 DIAGNOSIS — M19071 Primary osteoarthritis, right ankle and foot: Secondary | ICD-10-CM

## 2020-01-27 DIAGNOSIS — G5782 Other specified mononeuropathies of left lower limb: Secondary | ICD-10-CM

## 2020-01-27 NOTE — Progress Notes (Signed)
Repeated previous order, however did not do anything for R MPJ pain (resolved), nor added lift.  N/C per dr Al Corpus

## 2020-04-18 ENCOUNTER — Encounter: Payer: Self-pay | Admitting: Pulmonary Disease

## 2020-04-18 ENCOUNTER — Ambulatory Visit: Payer: BC Managed Care – PPO | Admitting: Pulmonary Disease

## 2020-04-18 ENCOUNTER — Other Ambulatory Visit: Payer: Self-pay

## 2020-04-18 VITALS — BP 132/88 | HR 77 | Temp 97.8°F | Ht 66.0 in | Wt 205.2 lb

## 2020-04-18 DIAGNOSIS — R0683 Snoring: Secondary | ICD-10-CM

## 2020-04-18 NOTE — Progress Notes (Signed)
[facility]  Chief Complaint  Patient presents with  . Consult    sleep consult    Constitutional:  BP 132/88 (BP Location: Left Arm, Cuff Size: Normal)   Pulse 77   Temp 97.8 F (36.6 C) (Other (Comment)) Comment (Src): wrist  Ht 5\' 6"  (1.676 m)   Wt 205 lb 3.2 oz (93.1 kg)   SpO2 99% Comment: Room air  BMI 33.12 kg/m   Past Medical History:  Allergies, Asthma, OA  Past Surgical History:  His  has a past surgical history that includes Spine surgery and Neck surgery.  Brief Summary:  Dale Singh is a 54 y.o. male with snoring.      Subjective:   His wife has been concerned about his snoring.  She says he stops breathing at night.  He will wake up hearing himself snore.  He is a restless sleeper.  He has been getting leg cramps at night.  He falls asleep frequently while watching TV.  He goes to sleep at 9 pm.  He falls asleep quickly.  He wakes up some times to use the bathroom.  He gets out of bed at 430 am on work days, but can sleep into 8 am on days off.  He feels tired in the morning.  He denies morning headache.  He does not use anything to help him fall sleep or stay awake.  He denies sleep walking, sleep talking, bruxism, or nightmares.  There is no history of restless legs.  He denies sleep hallucinations, sleep paralysis, or cataplexy.  The Epworth score is 16 out of 24.    Physical Exam:   Appearance - well kempt   ENMT - no sinus tenderness, no oral exudate, no LAN, Mallampati 4 airway, no stridor, enlarged tongue, elongated uvula, low laying soft palate  Respiratory - equal breath sounds bilaterally, no wheezing or rales  CV - s1s2 regular rate and rhythm, no murmurs  Ext - no clubbing, no edema  Skin - no rashes  Psych - normal mood and affect   Sleep Tests:    Social History:  He  reports that he has never smoked. He has never used smokeless tobacco. He reports current alcohol use. He reports  that he does not use drugs.  Family History:  His family history includes Diabetes in his father and mother.    Discussion:  He has snoring, sleep disruption, apnea, and daytime sleepiness.  I am concerned he could have obstructive sleep apnea.  Assessment/Plan:   Snoring with excessive daytime sleepiness. - will need to arrange for a home sleep study  Obesity. - discussed how weight can impact sleep and risk for sleep disordered breathing - discussed options to assist with weight loss: combination of diet modification, cardiovascular and strength training exercises  Cardiovascular risk. - had an extensive discussion regarding the adverse health consequences related to untreated sleep disordered breathing - specifically discussed the risks for hypertension, coronary artery disease, cardiac dysrhythmias, cerebrovascular disease, and diabetes - lifestyle modification discussed  Safe driving practices. - discussed how sleep disruption can increase risk of accidents, particularly when driving - safe driving practices were discussed  Therapies for obstructive sleep apnea. - if the sleep study shows significant sleep apnea, then various therapies for treatment were reviewed: CPAP, oral appliance, and surgical interventions   Time Spent Involved in Patient Care on Day of Examination:  31 minutes  Follow up:  Patient Instructions  Will arrange for home  sleep study Will call to arrange for follow up after sleep study reviewed    Medication List:   Allergies as of 04/18/2020   No Known Allergies     Medication List       Accurate as of April 18, 2020 12:20 PM. If you have any questions, ask your nurse or doctor.        celecoxib 400 MG capsule Commonly known as: CELEBREX Take by mouth.   Vitamin D (Ergocalciferol) 1.25 MG (50000 UNIT) Caps capsule Commonly known as: DRISDOL TAKE 1 CAPSULE BY MOUTH ONE TIME PER WEEK   Vitamin D3 125 MCG (5000 UT) Tabs 5,000 IU  OTC vitamin D3 daily.       Signature:  Coralyn Helling, MD [facility] Pager - [phone] 04/18/2020, 12:20 PM

## 2020-04-18 NOTE — Patient Instructions (Signed)
Will arrange for home sleep study Will call to arrange for follow up after sleep study reviewed  

## 2020-06-05 ENCOUNTER — Ambulatory Visit: Payer: BC Managed Care – PPO

## 2020-06-05 ENCOUNTER — Other Ambulatory Visit: Payer: Self-pay

## 2020-06-05 DIAGNOSIS — G4733 Obstructive sleep apnea (adult) (pediatric): Secondary | ICD-10-CM

## 2020-06-05 DIAGNOSIS — R0683 Snoring: Secondary | ICD-10-CM

## 2020-06-07 ENCOUNTER — Telehealth: Payer: Self-pay | Admitting: Pulmonary Disease

## 2020-06-07 DIAGNOSIS — G4733 Obstructive sleep apnea (adult) (pediatric): Secondary | ICD-10-CM

## 2020-06-07 NOTE — Telephone Encounter (Signed)
Called and went over HST results per Dr Craige Cotta with patient. All questions answered and patient expressed full understanding. Scheduled office visit with NP for Friday, 06/09/2020 at 4:30pm at the [facility] office. Patient agreeable to time, date and location. Nothing further needed at this time.

## 2020-06-07 NOTE — Telephone Encounter (Signed)
HST 06/05/20 >> AHI 45.2, SpO2 low 59%.   Please inform him that his sleep study shows severe obstructive sleep apnea.  Please arrange for ROV with me or NP to discuss treatment options.

## 2020-06-09 ENCOUNTER — Encounter: Payer: Self-pay | Admitting: Primary Care

## 2020-06-09 ENCOUNTER — Other Ambulatory Visit: Payer: Self-pay

## 2020-06-09 ENCOUNTER — Ambulatory Visit (INDEPENDENT_AMBULATORY_CARE_PROVIDER_SITE_OTHER): Payer: BC Managed Care – PPO | Admitting: Primary Care

## 2020-06-09 VITALS — BP 122/72 | HR 77 | Temp 97.4°F | Ht 66.0 in | Wt 204.0 lb

## 2020-06-09 DIAGNOSIS — G4733 Obstructive sleep apnea (adult) (pediatric): Secondary | ICD-10-CM | POA: Diagnosis not present

## 2020-06-09 NOTE — Patient Instructions (Signed)
Nice meeting you today Mr Dale Singh  Sleep study showed severe obstructive sleep apnea, 45 events an hour  Recommendation:  - Start CPAP d/t severity of sleep apnea (there is a 2 month wait for machines d/t back log) - Aim to wear CPAP every night for 4-6 hours or more - Do not drive if experiencing excessive daytime fatigue - Avoid sedating medication or excessive alcohol use prior to bedtime as these can worsen underlying sleep apnea   Orders: Auto CPAP 5-20cm h20, mask of choice, chin strap, supplies, heated humidity, enroll in airview  Follow-up: 3 months with Dr. Craige CottaSood or Waynetta SandyBeth - televisit ok    CPAP and BPAP Information CPAP and BPAP are methods of helping a person breathe with the use of air pressure. CPAP stands for "continuous positive airway pressure." BPAP stands for "bi-level positive airway pressure." In both methods, air is blown through your nose or mouth and into your air passages to help you breathe well. CPAP and BPAP use different amounts of pressure to blow air. With CPAP, the amount of pressure stays the same while you breathe in and out. With BPAP, the amount of pressure is increased when you breathe in (inhale) so that you can take larger breaths. Your health care provider will recommend whether CPAP or BPAP would be more helpful for you. Why are CPAP and BPAP treatments used? CPAP or BPAP can be helpful if you have:  Sleep apnea.  Chronic obstructive pulmonary disease (COPD).  Heart failure.  Medical conditions that weaken the muscles of the chest including muscular dystrophy, or neurological diseases such as amyotrophic lateral sclerosis (ALS).  Other problems that cause breathing to be weak, abnormal, or difficult. CPAP is most commonly used for obstructive sleep apnea (OSA) to keep the airways from collapsing when the muscles relax during sleep. How is CPAP or BPAP administered? Both CPAP and BPAP are provided by a small machine with a flexible plastic tube that  attaches to a plastic mask. You wear the mask. Air is blown through the mask into your nose or mouth. The amount of pressure that is used to blow the air can be adjusted on the machine. Your health care provider will determine the pressure setting that should be used based on your individual needs. When should CPAP or BPAP be used? In most cases, the mask only needs to be worn during sleep. Generally, the mask needs to be worn throughout the night and during any daytime naps. People with certain medical conditions may also need to wear the mask at other times when they are awake. Follow instructions from your health care provider about when to use the machine. What are some tips for using the mask?   Because the mask needs to be snug, some people feel trapped or closed-in (claustrophobic) when first using the mask. If you feel this way, you may need to get used to the mask. One way to do this is by holding the mask loosely over your nose or mouth and then gradually applying the mask more snugly. You can also gradually increase the amount of time that you use the mask.  Masks are available in various types and sizes. Some fit over your mouth and nose while others fit over just your nose. If your mask does not fit well, talk with your health care provider about getting a different one.  If you are using a mask that fits over your nose and you tend to breathe through your mouth, a  chin strap may be applied to help keep your mouth closed.  The CPAP and BPAP machines have alarms that may sound if the mask comes off or develops a leak.  If you have trouble with the mask, it is very important that you talk with your health care provider about finding a way to make the mask easier to tolerate. Do not stop using the mask. Stopping the use of the mask could have a negative impact on your health. What are some tips for using the machine?  Place your CPAP or BPAP machine on a secure table or stand near an  electrical outlet.  Know where the on/off switch is located on the machine.  Follow instructions from your health care provider about how to set the pressure on your machine and when you should use it.  Do not eat or drink while the CPAP or BPAP machine is on. Food or fluids could get pushed into your lungs by the pressure of the CPAP or BPAP.  Do not smoke. Tobacco smoke residue can damage the machine.  For home use, CPAP and BPAP machines can be rented or purchased through home health care companies. Many different brands of machines are available. Renting a machine before purchasing may help you find out which particular machine works well for you.  Keep the CPAP or BPAP machine and attachments clean. Ask your health care provider for specific instructions. Get help right away if:  You have redness or open areas around your nose or mouth where the mask fits.  You have trouble using the CPAP or BPAP machine.  You cannot tolerate wearing the CPAP or BPAP mask.  You have pain, discomfort, and bloating in your abdomen. Summary  CPAP and BPAP are methods of helping a person breathe with the use of air pressure.  Both CPAP and BPAP are provided by a small machine with a flexible plastic tube that attaches to a plastic mask.  If you have trouble with the mask, it is very important that you talk with your health care provider about finding a way to make the mask easier to tolerate. This information is not intended to replace advice given to you by your health care provider. Make sure you discuss any questions you have with your health care provider. Document Revised: 10/07/2018 Document Reviewed: 05/06/2016 Elsevier Patient Education  2020 [employer]   Sleep Apnea Sleep apnea affects breathing during sleep. It causes breathing to stop for a short time or to become shallow. It can also increase the risk of:  Heart attack.  Stroke.  Being very overweight  (obese).  Diabetes.  Heart failure.  Irregular heartbeat. The goal of treatment is to help you breathe normally again. What are the causes? There are three kinds of sleep apnea:  Obstructive sleep apnea. This is caused by a blocked or collapsed airway.  Central sleep apnea. This happens when the brain does not send the right signals to the muscles that control breathing.  Mixed sleep apnea. This is a combination of obstructive and central sleep apnea. The most common cause of this condition is a collapsed or blocked airway. This can happen if:  Your throat muscles are too relaxed.  Your tongue and tonsils are too large.  You are overweight.  Your airway is too small. What increases the risk?  Being overweight.  Smoking.  Having a small airway.  Being older.  Being male.  Drinking alcohol.  Taking medicines to calm yourself (sedatives or  tranquilizers).  Having family members with the condition. What are the signs or symptoms?  Trouble staying asleep.  Being sleepy or tired during the day.  Getting angry a lot.  Loud snoring.  Headaches in the morning.  Not being able to focus your mind (concentrate).  Forgetting things.  Less interest in sex.  Mood swings.  Personality changes.  Feelings of sadness (depression).  Waking up a lot during the night to pee (urinate).  Dry mouth.  Sore throat. How is this diagnosed?  Your medical history.  A physical exam.  A test that is done when you are sleeping (sleep study). The test is most often done in a sleep lab but may also be done at home. How is this treated?   Sleeping on your side.  Using a medicine to get rid of mucus in your nose (decongestant).  Avoiding the use of alcohol, medicines to help you relax, or certain pain medicines (narcotics).  Losing weight, if needed.  Changing your diet.  Not smoking.  Using a machine to open your airway while you sleep, such as: ? An oral  appliance. This is a mouthpiece that shifts your lower jaw forward. ? A CPAP device. This device blows air through a mask when you breathe out (exhale). ? An EPAP device. This has valves that you put in each nostril. ? A BPAP device. This device blows air through a mask when you breathe in (inhale) and breathe out.  Having surgery if other treatments do not work. It is important to get treatment for sleep apnea. Without treatment, it can lead to:  High blood pressure.  Coronary artery disease.  In men, not being able to have an erection (impotence).  Reduced thinking ability. Follow these instructions at home: Lifestyle  Make changes that your doctor recommends.  Eat a healthy diet.  Lose weight if needed.  Avoid alcohol, medicines to help you relax, and some pain medicines.  Do not use any products that contain nicotine or tobacco, such as cigarettes, e-cigarettes, and chewing tobacco. If you need help quitting, ask your doctor. General instructions  Take over-the-counter and prescription medicines only as told by your doctor.  If you were given a machine to use while you sleep, use it only as told by your doctor.  If you are having surgery, make sure to tell your doctor you have sleep apnea. You may need to bring your device with you.  Keep all follow-up visits as told by your doctor. This is important. Contact a doctor if:  The machine that you were given to use during sleep bothers you or does not seem to be working.  You do not get better.  You get worse. Get help right away if:  Your chest hurts.  You have trouble breathing in enough air.  You have an uncomfortable feeling in your back, arms, or stomach.  You have trouble talking.  One side of your body feels weak.  A part of your face is hanging down. These symptoms may be an emergency. Do not wait to see if the symptoms will go away. Get medical help right away. Call your local emergency services (911  in the [place] Do not drive yourself to the hospital. Summary  This condition affects breathing during sleep.  The most common cause is a collapsed or blocked airway.  The goal of treatment is to help you breathe normally while you sleep. This information is not intended to replace advice given to you  by your health care provider. Make sure you discuss any questions you have with your health care provider. Document Revised: 04/03/2018 Document Reviewed: 02/10/2018 Elsevier Patient Education  2020 [employer].

## 2020-06-09 NOTE — Progress Notes (Signed)
@Patient  ID: , male    DOB: 1965/07/08, 54 y.o.   MRN: [id number]  Chief Complaint  Patient presents with   Follow-up    HST results    Referring provider: No ref. provider found  HPI: 54 year old male, never smoker. PMH significant for OSA, hyperlipidemia, elevated BP reading. Patient of Dr. 57, seen for initial consult on 04/18/20 for snoring.   06/09/2020 Patient presents today to review sleep study results. He has no complaints. Feels he sleeps well at night. Wakes up 1-2 times to use restroom. Wife notices he stops breathing, she wears CPAP. He doesn't have any significant daytime fatigue. HST completed on 06/05/20 showed severe OSA, AHI 45.2 with SpO2 low 59%. Discussed treatment options including weight loss, side sleeping position, oral appliance, CPAP and referral to ENT. Due to severity of OSA we are recommending patient start CPAP and he is agreeing with plan.   No Known Allergies   There is no immunization history on file for this patient.  Past Medical History:  Diagnosis Date   Allergy    Arthritis    Asthma     Tobacco History: Social History   Tobacco Use  Smoking Status Never Smoker  Smokeless Tobacco Never Used   Counseling given: Not Answered   Outpatient Medications Prior to Visit  Medication Sig Dispense Refill   celecoxib (CELEBREX) 400 MG capsule Take by mouth.     Cholecalciferol (VITAMIN D3) 5000 units TABS 5,000 IU OTC vitamin D3 daily. 90 tablet 3   Vitamin D, Ergocalciferol, (DRISDOL) 1.25 MG (50000 UNIT) CAPS capsule TAKE 1 CAPSULE BY MOUTH ONE TIME PER WEEK 2 capsule 0   No facility-administered medications prior to visit.   Review of Systems  Review of Systems  Constitutional: Negative.  Negative for fatigue.  Respiratory: Negative.   Psychiatric/Behavioral: Negative for sleep disturbance.   Physical Exam  BP 122/72 (BP Location: Left Arm, Cuff Size: Normal)    Pulse 77    Temp (!) 97.4 F (36.3 C) (Oral)     Ht 5\' 6"  (1.676 m)    Wt 204 lb (92.5 kg)    SpO2 100%    BMI 32.93 kg/m  Physical Exam Constitutional:      Appearance: Normal appearance.  HENT:     Mouth/Throat:     Comments: Deferred d/t masking Cardiovascular:     Rate and Rhythm: Normal rate and regular rhythm.  Pulmonary:     Effort: Pulmonary effort is normal.     Breath sounds: Normal breath sounds.  Musculoskeletal:        General: Normal range of motion.  Skin:    General: Skin is warm and dry.     Comments: Facial hair  Neurological:     General: No focal deficit present.     Mental Status: He is alert and oriented to person, place, and time. Mental status is at baseline.  Psychiatric:        Mood and Affect: Mood normal.        Behavior: Behavior normal.        Thought Content: Thought content normal.        Judgment: Judgment normal.      Lab Results:  CBC    Component Value Date/Time   WBC 8.5 04/24/2018 0918   WBC 16.2 (A) 11/26/2016 1817   WBC 7.7 01/17/2014 1012   RBC 4.94 04/24/2018 0918   RBC 4.88 11/26/2016 1817   RBC 4.99 01/17/2014 1012  HGB 15.2 04/24/2018 0918   HCT 45.4 04/24/2018 0918   PLT 254 04/24/2018 0918   MCV 92 04/24/2018 0918   MCH 30.8 04/24/2018 0918   MCH 32.5 (A) 11/26/2016 1817   MCH 30.9 01/17/2014 1012   MCHC 33.5 04/24/2018 0918   MCHC 36.1 (A) 11/26/2016 1817   MCHC 34.6 01/17/2014 1012   RDW 12.4 04/24/2018 0918   LYMPHSABS 2.4 04/24/2018 0918   MONOABS 0.8 01/17/2014 1012   EOSABS 0.1 04/24/2018 0918   BASOSABS 0.1 04/24/2018 0918    BMET    Component Value Date/Time   NA 140 04/24/2018 0918   K 4.6 04/24/2018 0918   CL 102 04/24/2018 0918   CO2 24 04/24/2018 0918   GLUCOSE 94 04/24/2018 0918   GLUCOSE 86 01/17/2014 1012   BUN 23 04/24/2018 0918   CREATININE 0.94 04/24/2018 0918   CREATININE 0.92 01/17/2014 1012   CALCIUM 9.4 04/24/2018 0918   GFRNONAA 93 04/24/2018 0918   GFRAA 107 04/24/2018 0918    BNP No results found for:  BNP  ProBNP No results found for: PROBNP  Imaging: No results found.   Assessment & Plan:   OSA (obstructive sleep apnea) - Sleep study on 06/05/20 showed severe  OSA, AHI 45.2/hr with SpO2 low 59% (average 90%) - Discussed risk of untreated sleep apnea and treatment options. Recommending patient start CPAP d/t severity of sleep apnea and patient agrees with plan (note-there is 6-8 week wait currently for machines) - Advised patient to aim to wear CPAP every night for 4-6 hours or more - Do not drive if experiencing excessive daytime fatigue - Avoid sedating medication or excessive alcohol use prior to bedtime as these can worsen underlying sleep apnea   Orders: Auto CPAP 5-20cm h20, mask of choice, chin strap, supplies, heated humidity, enroll in airview  Follow-up: 3 months with Dr. Craige Cotta or Beth - televisit ok      Glenford Bayley, NP 06/12/2020

## 2020-06-12 DIAGNOSIS — G4733 Obstructive sleep apnea (adult) (pediatric): Secondary | ICD-10-CM | POA: Insufficient documentation

## 2020-06-12 NOTE — Assessment & Plan Note (Signed)
-   Sleep study on 06/05/20 showed severe  OSA, AHI 45.2/hr with SpO2 low 59% (average 90%) - Discussed risk of untreated sleep apnea and treatment options. Recommending patient start CPAP d/t severity of sleep apnea and patient agrees with plan (note-there is 6-8 week wait currently for machines) - Advised patient to aim to wear CPAP every night for 4-6 hours or more - Do not drive if experiencing excessive daytime fatigue - Avoid sedating medication or excessive alcohol use prior to bedtime as these can worsen underlying sleep apnea   Orders: Auto CPAP 5-20cm h20, mask of choice, chin strap, supplies, heated humidity, enroll in airview  Follow-up: 3 months with Dr. Craige Cotta or Waynetta Sandy - televisit ok

## 2020-06-12 NOTE — Progress Notes (Signed)
Reviewed and agree with assessment/plan.   Elba Dendinger, MD [facility] 06/12/2020, 10:50 AM Pager:  [phone]  

## 2020-10-12 ENCOUNTER — Emergency Department (HOSPITAL_COMMUNITY): Payer: BC Managed Care – PPO

## 2020-10-12 ENCOUNTER — Encounter (HOSPITAL_COMMUNITY): Payer: Self-pay

## 2020-10-12 ENCOUNTER — Emergency Department (HOSPITAL_COMMUNITY)
Admission: EM | Admit: 2020-10-12 | Discharge: 2020-10-12 | Disposition: A | Discharge status: Home / Self Care | Payer: BC Managed Care – PPO | Attending: Emergency Medicine | Admitting: Emergency Medicine

## 2020-10-12 ENCOUNTER — Other Ambulatory Visit: Payer: Self-pay

## 2020-10-12 DIAGNOSIS — R112 Nausea with vomiting, unspecified: Secondary | ICD-10-CM | POA: Insufficient documentation

## 2020-10-12 DIAGNOSIS — J45909 Unspecified asthma, uncomplicated: Secondary | ICD-10-CM | POA: Insufficient documentation

## 2020-10-12 DIAGNOSIS — R42 Dizziness and giddiness: Secondary | ICD-10-CM | POA: Insufficient documentation

## 2020-10-12 LAB — CBC WITH DIFFERENTIAL/PLATELET
Abs Immature Granulocytes: 0.04 10*3/uL (ref 0.00–0.07)
Basophils Absolute: 0.1 10*3/uL (ref 0.0–0.1)
Basophils Relative: 1 %
Eosinophils Absolute: 0.1 10*3/uL (ref 0.0–0.5)
Eosinophils Relative: 1 %
HCT: 43.3 % (ref 39.0–52.0)
Hemoglobin: 14.9 g/dL (ref 13.0–17.0)
Immature Granulocytes: 0 %
Lymphocytes Relative: 11 %
Lymphs Abs: 1.2 10*3/uL (ref 0.7–4.0)
MCH: 32.2 pg (ref 26.0–34.0)
MCHC: 34.4 g/dL (ref 30.0–36.0)
MCV: 93.5 fL (ref 80.0–100.0)
Monocytes Absolute: 0.6 10*3/uL (ref 0.1–1.0)
Monocytes Relative: 6 %
Neutro Abs: 9.1 10*3/uL — ABNORMAL HIGH (ref 1.7–7.7)
Neutrophils Relative %: 81 %
Platelets: 141 10*3/uL — ABNORMAL LOW (ref 150–400)
RBC: 4.63 MIL/uL (ref 4.22–5.81)
RDW: 12.4 % (ref 11.5–15.5)
WBC: 11.1 10*3/uL — ABNORMAL HIGH (ref 4.0–10.5)
nRBC: 0 % (ref 0.0–0.2)

## 2020-10-12 LAB — BASIC METABOLIC PANEL
Anion gap: 7 (ref 5–15)
BUN: 18 mg/dL (ref 6–20)
CO2: 20 mmol/L — ABNORMAL LOW (ref 22–32)
Calcium: 8.3 mg/dL — ABNORMAL LOW (ref 8.9–10.3)
Chloride: 109 mmol/L (ref 98–111)
Creatinine, Ser: 0.64 mg/dL (ref 0.61–1.24)
GFR, Estimated: >60 mL/min (ref >60)
Glucose, Bld: 94 mg/dL (ref 70–99)
Potassium: 3.8 mmol/L (ref 3.5–5.1)
Sodium: 136 mmol/L (ref 135–145)

## 2020-10-12 LAB — I-STAT CHEM 8, ED
BUN: 19 mg/dL (ref 6–20)
Calcium, Ion: 1.07 mmol/L — ABNORMAL LOW (ref 1.15–1.40)
Chloride: 106 mmol/L (ref 98–111)
Creatinine, Ser: 0.6 mg/dL — ABNORMAL LOW (ref 0.61–1.24)
Glucose, Bld: 94 mg/dL (ref 70–99)
HCT: 41 % (ref 39.0–52.0)
Hemoglobin: 13.9 g/dL (ref 13.0–17.0)
Potassium: 3.7 mmol/L (ref 3.5–5.1)
Sodium: 139 mmol/L (ref 135–145)
TCO2: 24 mmol/L (ref 22–32)

## 2020-10-12 MED ORDER — SODIUM CHLORIDE 0.9 % IV BOLUS
1000.0000 mL | Freq: Once | INTRAVENOUS | Status: AC
  Administered 2020-10-12: 1000 mL via INTRAVENOUS

## 2020-10-12 MED ORDER — MECLIZINE HCL 25 MG PO TABS: 25.0000 mg | ORAL_TABLET | Freq: Three times a day (TID) | ORAL | 0 refills | Status: DC | PRN

## 2020-10-12 MED ORDER — ONDANSETRON 4 MG PO TBDP: 4.0000 mg | ORAL_TABLET | Freq: Three times a day (TID) | ORAL | 0 refills | Status: DC | PRN

## 2020-10-12 MED ORDER — IOHEXOL 350 MG/ML SOLN
75.0000 mL | Freq: Once | INTRAVENOUS | Status: AC | PRN
  Administered 2020-10-12: 75 mL via INTRAVENOUS

## 2020-10-12 MED ORDER — MECLIZINE HCL 25 MG PO TABS
25.0000 mg | ORAL_TABLET | Freq: Once | ORAL | Status: AC
  Administered 2020-10-12: 25 mg via ORAL
  Filled 2020-10-12: qty 1

## 2020-10-12 NOTE — ED Notes (Signed)
Patient verbalizes understanding of discharge instructions. Opportunity for questioning and answers were provided. Armband removed by staff, pt discharged from ED.  

## 2020-10-12 NOTE — ED Provider Notes (Signed)
[facility] EMERGENCY DEPARTMENT Provider Note   CSN: [id number] Arrival date & time: 10/12/20  1056     History Chief Complaint  Patient presents with  . Dizziness    Dale Singh is a 55 y.o. male.  55 year old male with past medical history of asthma, OSA presents with complaint of dizziness with nausea and vomiting.  Patient states that his symptoms started in the middle of the night when he rolled over in bed and had sudden onset of dizziness described as the move and moving when looking outside his window.  Reports ongoing dizziness worse with changes in position.  Patient went to his chiropractor this morning who did an adjustment without improvement in symptoms.  Patient states afterwards he was walking when he suddenly fell.  Patient's wife that was present at that time states that she was asking him what he wanted her to do and he was very anxious.  EMS was called.  Otherwise, no recent falls or injuries, is not anticoagulated, reports usual allergy symptoms, no recent respiratory infections, no history of vertigo.  Denies changes in speech, unilateral weakness or numbness.  Patient is a non-smoker.  No other complaints or concerns.        Past Medical History:  Diagnosis Date  . Allergy   . Arthritis   . Asthma     Patient Active Problem List   Diagnosis Date Noted  . OSA (obstructive sleep apnea) 06/12/2020  . Hyperlipidemia 04/15/2018  . Prediabetes 04/15/2018  . Vitamin D deficiency 04/15/2018  . 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 High arches 04/15/2018  . Plantar fasciitis, bilateral 04/15/2018  . Elevated blood pressure reading 04/15/2018  . Class 1 obesity in adult 07/08/2017  . Environmental and seasonal allergies 07/08/2017  . Osteoarthritis 07/08/2017    Past Surgical History:  Procedure Laterality Date  . NECK SURGERY    . SPINE SURGERY         Family History  Problem Relation Age of Onset  . Diabetes Mother   . Diabetes  Father     Social History   Tobacco Use  . Smoking status: Never Smoker  . Smokeless tobacco: Never Used  Substance Use Topics  . Alcohol use: Yes  . Drug use: No    Home Medications Prior to Admission medications   Medication Sig Start Date End Date Taking? Authorizing Provider  meclizine (ANTIVERT) 25 MG tablet Take 1 tablet (25 mg total) by mouth 3 (three) times daily as needed for dizziness. 10/12/20  Yes Jeannie Fend, PA-C  ondansetron (ZOFRAN ODT) 4 MG disintegrating tablet Take 1 tablet (4 mg total) by mouth every 8 (eight) hours as needed for nausea or vomiting. 10/12/20  Yes Jeannie Fend, PA-C  celecoxib (CELEBREX) 400 MG capsule Take by mouth. 12/28/19   [provider]  Cholecalciferol (VITAMIN D3) 5000 units TABS 5,000 IU OTC vitamin D3 daily. 07/08/17   Opalski, Deborah, DO  Vitamin D, Ergocalciferol, (DRISDOL) 1.25 MG (50000 UNIT) CAPS capsule TAKE 1 CAPSULE BY MOUTH ONE TIME PER WEEK 11/03/19   Mayer Masker, PA-C    Allergies    Patient has no known allergies.  Review of Systems   Review of Systems  Constitutional: Negative for chills and fever.  Eyes: Negative for visual disturbance.  Respiratory: Negative for shortness of breath.   Cardiovascular: Negative for chest pain.  Gastrointestinal: Positive for nausea and vomiting. Negative for abdominal pain.  Genitourinary: Negative for difficulty urinating.  Musculoskeletal: Negative for arthralgias,  myalgias, neck pain and neck stiffness.  Skin: Negative for rash and wound.  Allergic/Immunologic: Negative for immunocompromised state.  Neurological: Positive for dizziness. Negative for speech difficulty, weakness and headaches.  Hematological: Does not bruise/bleed easily.  Psychiatric/Behavioral: Negative for confusion.  All other systems reviewed and are negative.   Physical Exam Updated Vital Signs BP 133/90   Pulse 78   Temp 98.2 F (36.8 C) (Oral)   Resp 16   Ht 5\' 6"  (1.676 m)   Wt 83.5  kg   SpO2 99%   BMI 29.70 kg/m   Physical Exam Vitals and nursing note reviewed.  Constitutional:      General: He is not in acute distress.    Appearance: He is well-developed. He is not diaphoretic.  HENT:     Head: Normocephalic and atraumatic.     Nose: Nose normal.     Mouth/Throat:     Mouth: Mucous membranes are moist.  Eyes:     Extraocular Movements: Extraocular movements intact.     Pupils: Pupils are equal, round, and reactive to light.  Pulmonary:     Effort: Pulmonary effort is normal.  Abdominal:     Palpations: Abdomen is soft.     Tenderness: There is no abdominal tenderness.  Musculoskeletal:        General: No swelling, tenderness, deformity or signs of injury. Normal range of motion.     Right lower leg: No edema.     Left lower leg: No edema.  Skin:    General: Skin is warm and dry.     Findings: No erythema or rash.  Neurological:     General: No focal deficit present.     Mental Status: He is alert and oriented to person, place, and time.     GCS: GCS eye subscore is 4. GCS verbal subscore is 5. GCS motor subscore is 6.     Cranial Nerves: No cranial nerve deficit.     Sensory: No sensory deficit.     Motor: No weakness, tremor or pronator drift.     Coordination: Coordination is intact. Coordination normal. Heel to Shin Test normal. Rapid alternating movements normal.     Deep Tendon Reflexes: Reflexes normal.  Psychiatric:        Behavior: Behavior normal.     ED Results / Procedures / Treatments   Labs (all labs ordered are listed, but only abnormal results are displayed) Labs Reviewed  CBC WITH DIFFERENTIAL/PLATELET - Abnormal; Notable for the following components:      Result Value   WBC 11.1 (*)    Platelets 141 (*)    Neutro Abs 9.1 (*)    All other components within normal limits  BASIC METABOLIC PANEL - Abnormal; Notable for the following components:   CO2 20 (*)    Calcium 8.3 (*)    All other components within normal limits   I-STAT CHEM 8, ED - Abnormal; Notable for the following components:   Creatinine, Ser 0.60 (*)    Calcium, Ion 1.07 (*)    All other components within normal limits    EKG None  Radiology CT Angio Head W or Wo Contrast  Result Date: 10/12/2020 CLINICAL DATA:  Dizziness, nonspecific; dizziness, recent chiropractic manipulation. EXAM: CT ANGIOGRAPHY HEAD AND NECK TECHNIQUE: Multidetector CT imaging of the head and neck was performed using the standard protocol during bolus administration of intravenous contrast. Multiplanar CT image reconstructions and MIPs were obtained to evaluate the vascular anatomy. Carotid stenosis measurements (  when applicable) are obtained utilizing NASCET criteria, using the distal internal carotid diameter as the denominator. CONTRAST:  88mL OMNIPAQUE IOHEXOL 350 MG/ML SOLN COMPARISON:  None. FINDINGS: CT HEAD FINDINGS Brain: Cerebral volume is normal. There is no acute intracranial hemorrhage. No demarcated cortical infarct. No extra-axial fluid collection. No evidence of intracranial mass. No midline shift. Vascular: No hyperdense vessel. Atherosclerotic calcifications. Skull: Normal. Negative for fracture or focal lesion. Sinuses: Trace mucosal thickening within the inferior right maxillary sinus. Orbits: No mass or acute finding. Review of the MIP images confirms the above findings CTA NECK FINDINGS Aortic arch: Standard aortic branching. The visualized aortic arch is normal in caliber. No hemodynamically significant innominate or proximal subclavian artery stenosis. Right carotid system: CCA and ICA patent within the neck without stenosis. No significant atherosclerotic disease. Tortuosity of the cervical ICA. Left carotid system: CCA and ICA patent within the neck without stenosis. No significant atherosclerotic disease. Vertebral arteries: Vertebral arteries patent within the neck. Calcified plaque at the origin of the left vertebral artery results in mild ostial  stenosis (series 12, image 172). Skeleton: Cervical spondylosis and reversal of the expected cervical lordosis. Most notably at C4-C5, there is moderate/advanced disc space narrowing with a disc bulge, endplate spurring and uncovertebral hypertrophy. Bilateral bony neural foraminal narrowing and suspected mild/moderate spinal canal stenosis at this level. Prior C5-C7 ACDF. No acute bony abnormality or aggressive osseous lesion. Other neck: No neck mass or cervical lymph at. Upper chest: No consolidation within the imaged lung apices. Review of the MIP images confirms the above findings CTA HEAD FINDINGS Anterior circulation: The intracranial internal carotid arteries are patent. Minimal calcified plaque within the paraclinoid left ICA without stenosis. The M1 middle cerebral arteries are patent. No M2 proximal branch occlusion or high-grade proximal stenosis. Mild/moderate stenosis within a superior division proximal right M2 MCA branch vessel (series 15, image 17). No intracranial aneurysm is identified. Posterior circulation: The intracranial vertebral arteries are patent. The basilar artery is patent. The posterior cerebral arteries are patent. Posterior communicating arteries are hypoplastic or absent bilaterally. Venous sinuses: Within the limitations of contrast timing, no convincing thrombus. Anatomic variants: As described Review of the MIP images confirms the above findings IMPRESSION: CT head: 1. No evidence of acute intracranial abnormality. 2. Trace right maxillary sinus mucosal thickening. CTA neck: 1. The carotid and internal carotid arteries are patent within the neck without stenosis, evidence of dissection or significant atherosclerotic disease. 2. The vertebral arteries are patent within the neck without evidence of dissection. Atherosclerotic plaque results in mild stenosis at the origin of the left vertebral artery. 3. Reversal of the expected cervical lordosis. 4. Cervical spondylosis greatest  at C4-C5. 5. Prior C5-C7 ACDF. CTA head: 1. No intracranial large vessel occlusion or proximal high-grade arterial stenosis. 2. Minimal non-stenotic calcified plaque within the paraclinoid left ICA. 3. Mild/moderate focal stenosis within a superior division proximal M2 right MCA vessel. Electronically Signed   By: Jackey Loge DO   On: 10/12/2020 14:24   CT Angio Neck W and/or Wo Contrast  Result Date: 10/12/2020 CLINICAL DATA:  Dizziness, nonspecific; dizziness, recent chiropractic manipulation. EXAM: CT ANGIOGRAPHY HEAD AND NECK TECHNIQUE: Multidetector CT imaging of the head and neck was performed using the standard protocol during bolus administration of intravenous contrast. Multiplanar CT image reconstructions and MIPs were obtained to evaluate the vascular anatomy. Carotid stenosis measurements (when applicable) are obtained utilizing NASCET criteria, using the distal internal carotid diameter as the denominator. CONTRAST:  28mL OMNIPAQUE IOHEXOL 350  MG/ML SOLN COMPARISON:  None. FINDINGS: CT HEAD FINDINGS Brain: Cerebral volume is normal. There is no acute intracranial hemorrhage. No demarcated cortical infarct. No extra-axial fluid collection. No evidence of intracranial mass. No midline shift. Vascular: No hyperdense vessel. Atherosclerotic calcifications. Skull: Normal. Negative for fracture or focal lesion. Sinuses: Trace mucosal thickening within the inferior right maxillary sinus. Orbits: No mass or acute finding. Review of the MIP images confirms the above findings CTA NECK FINDINGS Aortic arch: Standard aortic branching. The visualized aortic arch is normal in caliber. No hemodynamically significant innominate or proximal subclavian artery stenosis. Right carotid system: CCA and ICA patent within the neck without stenosis. No significant atherosclerotic disease. Tortuosity of the cervical ICA. Left carotid system: CCA and ICA patent within the neck without stenosis. No significant atherosclerotic  disease. Vertebral arteries: Vertebral arteries patent within the neck. Calcified plaque at the origin of the left vertebral artery results in mild ostial stenosis (series 12, image 172). Skeleton: Cervical spondylosis and reversal of the expected cervical lordosis. Most notably at C4-C5, there is moderate/advanced disc space narrowing with a disc bulge, endplate spurring and uncovertebral hypertrophy. Bilateral bony neural foraminal narrowing and suspected mild/moderate spinal canal stenosis at this level. Prior C5-C7 ACDF. No acute bony abnormality or aggressive osseous lesion. Other neck: No neck mass or cervical lymph at. Upper chest: No consolidation within the imaged lung apices. Review of the MIP images confirms the above findings CTA HEAD FINDINGS Anterior circulation: The intracranial internal carotid arteries are patent. Minimal calcified plaque within the paraclinoid left ICA without stenosis. The M1 middle cerebral arteries are patent. No M2 proximal branch occlusion or high-grade proximal stenosis. Mild/moderate stenosis within a superior division proximal right M2 MCA branch vessel (series 15, image 17). No intracranial aneurysm is identified. Posterior circulation: The intracranial vertebral arteries are patent. The basilar artery is patent. The posterior cerebral arteries are patent. Posterior communicating arteries are hypoplastic or absent bilaterally. Venous sinuses: Within the limitations of contrast timing, no convincing thrombus. Anatomic variants: As described Review of the MIP images confirms the above findings IMPRESSION: CT head: 1. No evidence of acute intracranial abnormality. 2. Trace right maxillary sinus mucosal thickening. CTA neck: 1. The carotid and internal carotid arteries are patent within the neck without stenosis, evidence of dissection or significant atherosclerotic disease. 2. The vertebral arteries are patent within the neck without evidence of dissection. Atherosclerotic  plaque results in mild stenosis at the origin of the left vertebral artery. 3. Reversal of the expected cervical lordosis. 4. Cervical spondylosis greatest at C4-C5. 5. Prior C5-C7 ACDF. CTA head: 1. No intracranial large vessel occlusion or proximal high-grade arterial stenosis. 2. Minimal non-stenotic calcified plaque within the paraclinoid left ICA. 3. Mild/moderate focal stenosis within a superior division proximal M2 right MCA vessel. Electronically Signed   By: Jackey Loge DO   On: 10/12/2020 14:24    Procedures Procedures   Medications Ordered in ED Medications  sodium chloride 0.9 % bolus 1,000 mL (0 mLs Intravenous Stopped 10/12/20 1412)  meclizine (ANTIVERT) tablet 25 mg (25 mg Oral Given 10/12/20 1246)  iohexol (OMNIPAQUE) 350 MG/ML injection 75 mL (75 mLs Intravenous Contrast Given 10/12/20 1354)    ED Course  I have reviewed the triage vital signs and the nursing notes.  Pertinent labs & imaging results that were available during my care of the patient were reviewed by me and considered in my medical decision making (see chart for details).  Clinical Course as of 10/12/20 1627  Thu  Oct 12, 2020  [id number] 55 year old male with complaint of dizziness, described as room spinning, worse with movement, improves with rest.  Symptoms reproduced with movement.  Patient was given meclizine with improvement in symptoms.  With history of care prior to manipulation, had CTA of head and neck which does not show acute findings today. Patient is ambulatory with a steady gait with overall improvement.  Labs are reassuring including CBC, BMP.  Vitals are stable.  Plan is to discharge with prescription for Antivert and Zofran with referral to neurology, given return to ER precautions. [LM]    Clinical Course User Index [LM] Alden Hipp   MDM Rules/Calculators/A&P                          Final Clinical Impression(s) / ED Diagnoses Final diagnoses:  Dizziness    Rx / DC Orders ED  Discharge Orders         Ordered    ondansetron (ZOFRAN ODT) 4 MG disintegrating tablet  Every 8 hours PRN        10/12/20 1604    meclizine (ANTIVERT) 25 MG tablet  3 times daily PRN        10/12/20 1604           Jeannie Fend, PA-C 10/12/20 1627    Horton, Clabe Seal, DO 10/12/20 2001

## 2020-10-12 NOTE — ED Notes (Signed)
Pt ambulated to the restroom steadily and without any complaints.  Pt state he felt "not normal", denied being dizzy or lightheaded.

## 2020-10-12 NOTE — Discharge Instructions (Addendum)
Take meclizine as needed as prescribed for dizziness. Take Zofran as needed as prescribed for nausea and vomiting.  Follow-up with neurology, call to schedule an appointment. Return to the emergency room for new or worsening symptoms.

## 2020-10-12 NOTE — ED Triage Notes (Signed)
Pt bib ems with dizziness and nausea x 3 episodes, with 1 episode of emesis. Given 4 mg of Zofran en route.  Woke up, felt dizzy laying down and went back to sleep, then sitting, rolling over. Pt denies falling.

## 2020-10-16 ENCOUNTER — Encounter: Payer: Self-pay | Admitting: Neurology

## 2020-10-17 ENCOUNTER — Inpatient Hospital Stay: Payer: BC Managed Care – PPO | Admitting: Nurse Practitioner

## 2020-10-20 ENCOUNTER — Inpatient Hospital Stay: Payer: BC Managed Care – PPO | Admitting: Nurse Practitioner

## 2020-12-19 ENCOUNTER — Ambulatory Visit: Payer: BC Managed Care – PPO | Admitting: Neurology

## 2021-08-10 IMAGING — CT CT ANGIO NECK
1 of 11 series · 12 of 46 positions shown, 17 images · IV contrast (OMNI)
Comparison: None.

CLINICAL DATA: Dizziness, nonspecific; dizziness, recent
chiropractic manipulation.

EXAM:
CT ANGIOGRAPHY HEAD AND NECK
TECHNIQUE: Multidetector CT imaging of the head and neck was performed using
the standard protocol during bolus administration of intravenous
contrast. Multiplanar CT image reconstructions and MIPs were
obtained to evaluate the vascular anatomy. Carotid stenosis
measurements (when applicable) are obtained utilizing NASCET
criteria, using the distal internal carotid diameter as the
denominator.
CONTRAST:  75mL OMNIPAQUE IOHEXOL 350 MG/ML SOLN

[Series 10: thin · axial · 0.49mm/px · z∈[-266,+51]mm · 12 of 726 slices shown, 17 images]
[im 46/726  soft-tissue]
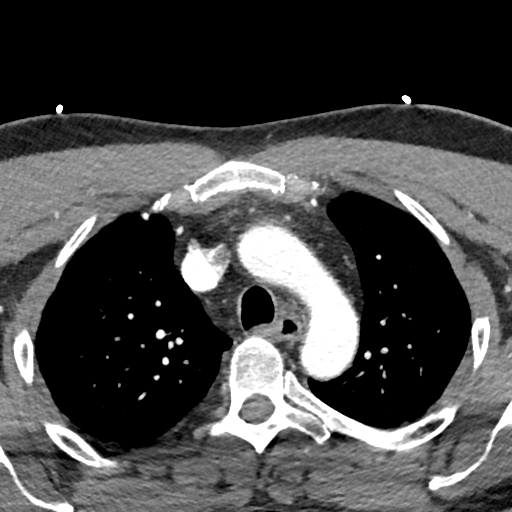
[im 46/726  bone]
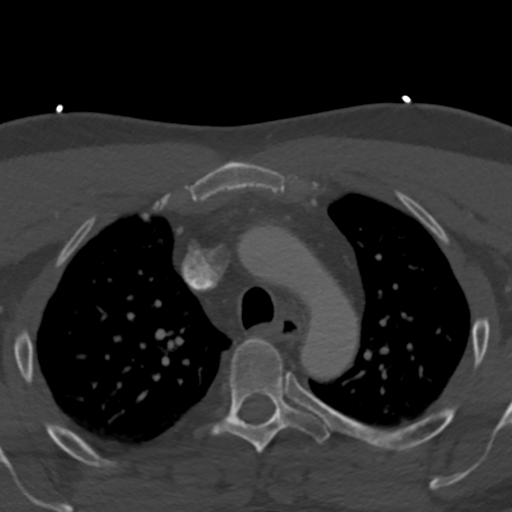
[im 136/726  soft-tissue]
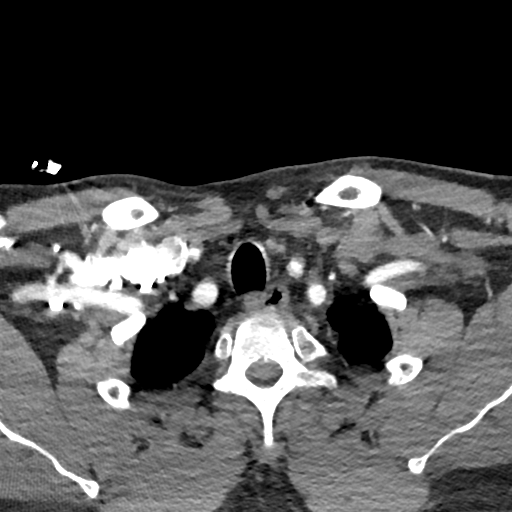
[im 182/726  soft-tissue]
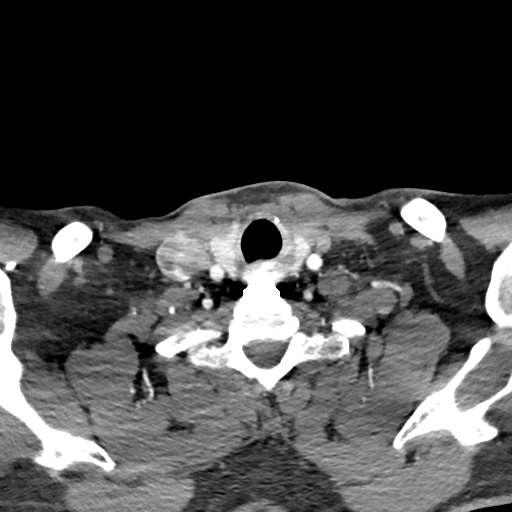
[im 227/726  soft-tissue]
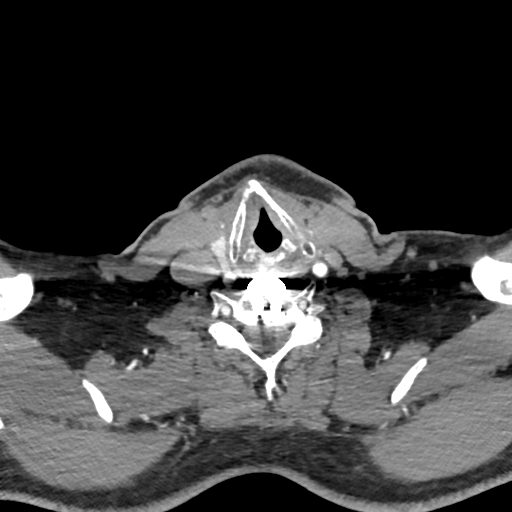
[im 318/726  soft-tissue]
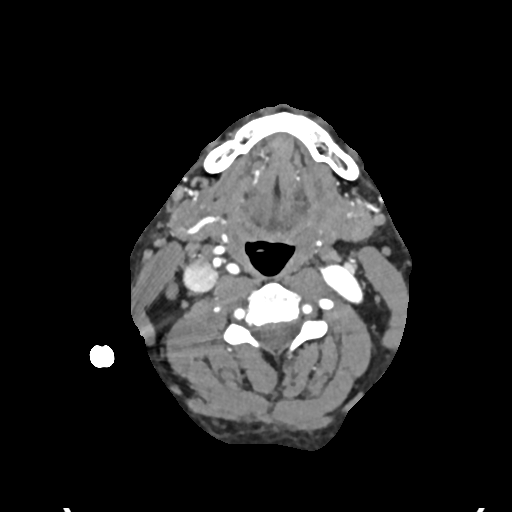
[im 363/726  soft-tissue]
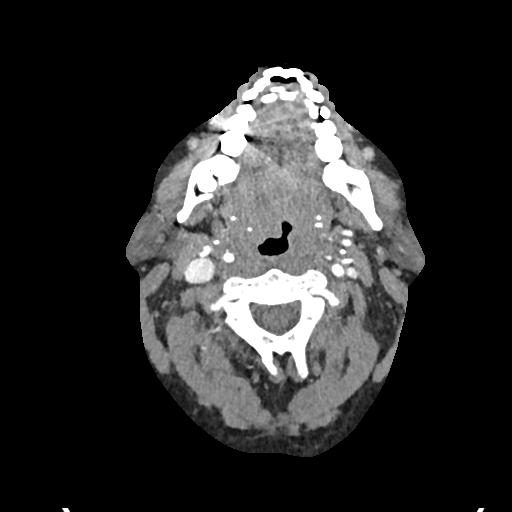
[im 408/726  soft-tissue]
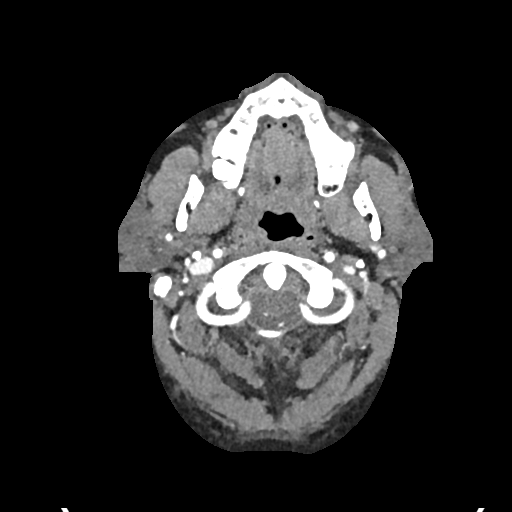
[im 499/726  soft-tissue]
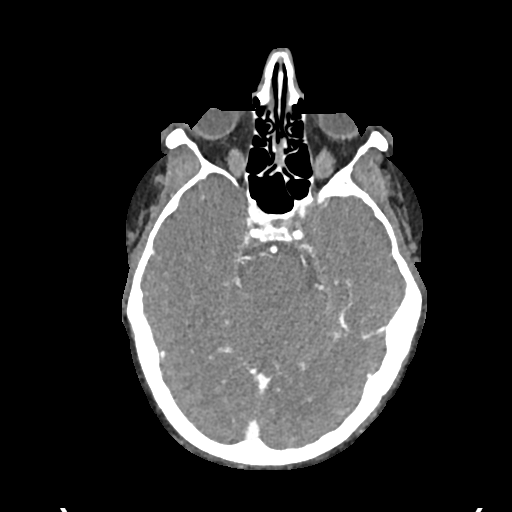
[im 544/726  soft-tissue]
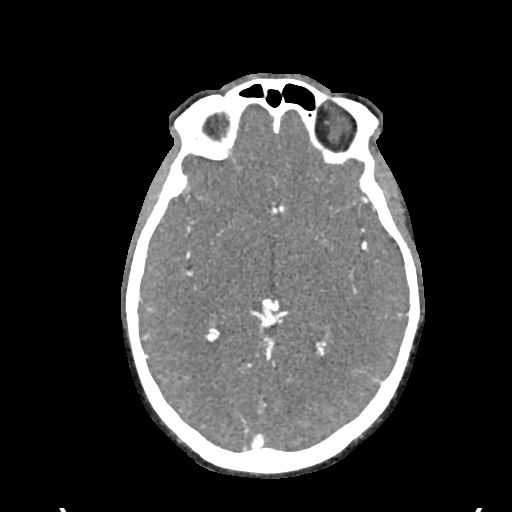
[im 544/726  lung]
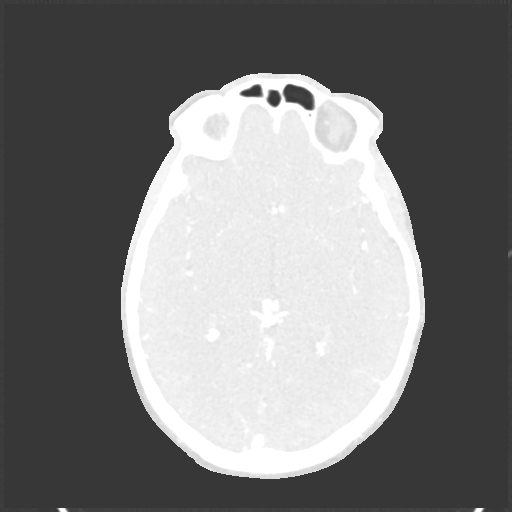
[im 544/726  bone]
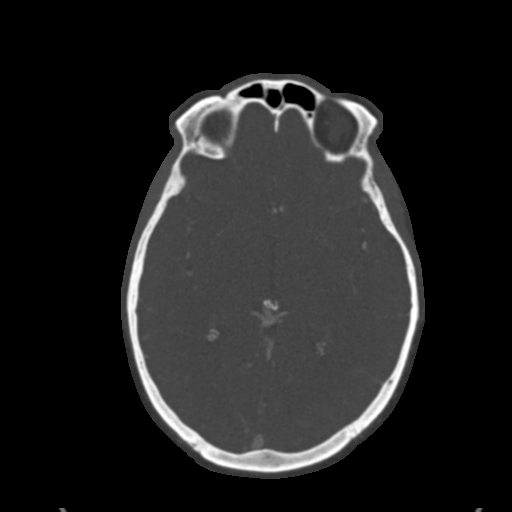
[im 590/726  soft-tissue]
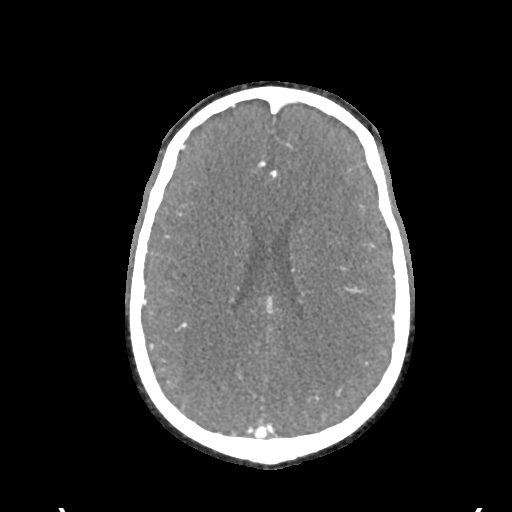
[im 590/726  lung]
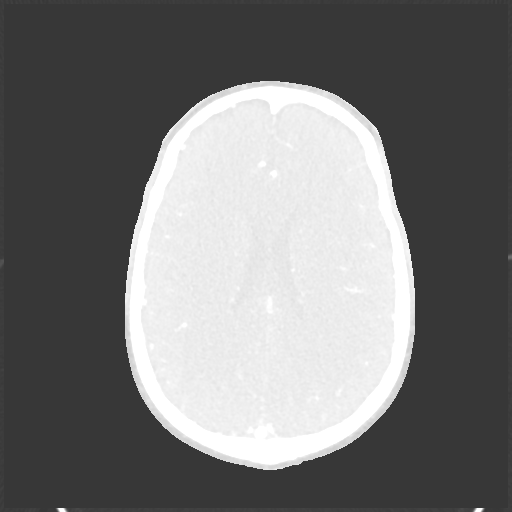
[im 635/726  lung]
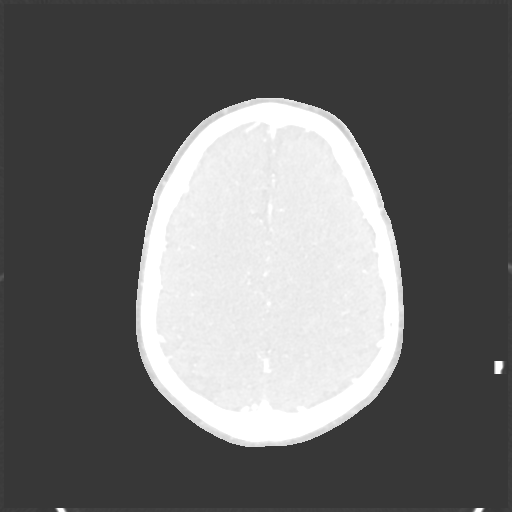
[im 680/726  soft-tissue]
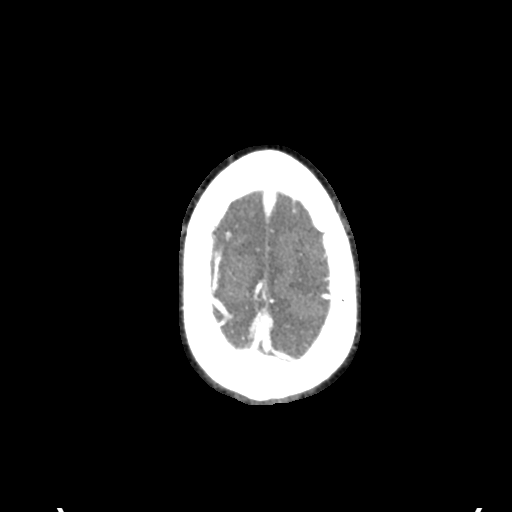
[im 680/726  lung]
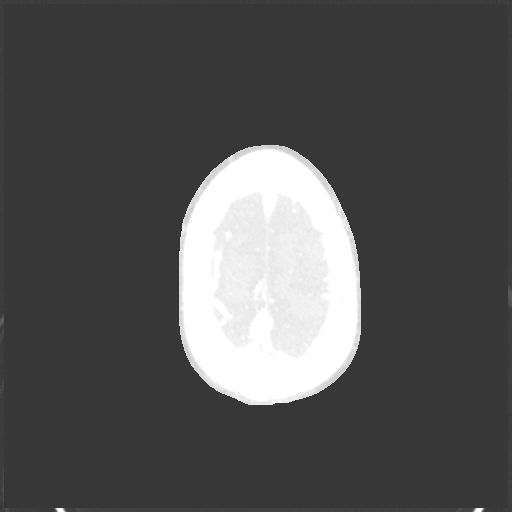

[12 of 46 positions shown; findings below may reference images not displayed]

FINDINGS: CT HEAD FINDINGS

Brain:

Cerebral volume is normal.

There is no acute intracranial hemorrhage.

No demarcated cortical infarct.

No extra-axial fluid collection.

No evidence of intracranial mass.

No midline shift.

Vascular: No hyperdense vessel. Atherosclerotic calcifications.

Skull: Normal. Negative for fracture or focal lesion.

Sinuses: Trace mucosal thickening within the inferior right
maxillary sinus.

Orbits: No mass or acute finding.

Review of the MIP images confirms the above findings

CTA NECK FINDINGS

Aortic arch: Standard aortic branching. The visualized aortic arch
is normal in caliber. No hemodynamically significant innominate or
proximal subclavian artery stenosis.

Right carotid system: CCA and ICA patent within the neck without
stenosis. No significant atherosclerotic disease. Tortuosity of the
cervical ICA.

Left carotid system: CCA and ICA patent within the neck without
stenosis. No significant atherosclerotic disease.

Vertebral arteries: Vertebral arteries patent within the neck.
Calcified plaque at the origin of the left vertebral artery results
in mild ostial stenosis (series 12, image 172).

Skeleton: Cervical spondylosis and reversal of the expected cervical
lordosis. Most notably at C4-C5, there is moderate/advanced disc
space narrowing with a disc bulge, endplate spurring and
uncovertebral hypertrophy. Bilateral bony neural foraminal narrowing
and suspected mild/moderate spinal canal stenosis at this level.
Prior C5-C7 ACDF. No acute bony abnormality or aggressive osseous
lesion.

Other neck: No neck mass or cervical lymph at.

Upper chest: No consolidation within the imaged lung apices.

Review of the MIP images confirms the above findings

CTA HEAD FINDINGS

Anterior circulation:

The intracranial internal carotid arteries are patent. Minimal
calcified plaque within the paraclinoid left ICA without stenosis.
The M1 middle cerebral arteries are patent. No M2 proximal branch
occlusion or high-grade proximal stenosis. Mild/moderate stenosis
within a superior division proximal right M2 MCA branch vessel
(series 15, image 17). No intracranial aneurysm is identified.

Posterior circulation:

The intracranial vertebral arteries are patent. The basilar artery
is patent. The posterior cerebral arteries are patent. Posterior
communicating arteries are hypoplastic or absent bilaterally.

Venous sinuses: Within the limitations of contrast timing, no
convincing thrombus.

Anatomic variants: As described

Review of the MIP images confirms the above findings
IMPRESSION: CT head:

1. No evidence of acute intracranial abnormality.
2. Trace right maxillary sinus mucosal thickening.

CTA neck:

1. The carotid and internal carotid arteries are patent within the
neck without stenosis, evidence of dissection or significant
atherosclerotic disease.
2. The vertebral arteries are patent within the neck without
evidence of dissection. Atherosclerotic plaque results in mild
stenosis at the origin of the left vertebral artery.
3. Reversal of the expected cervical lordosis.
4. Cervical spondylosis greatest at C4-C5.
5. Prior C5-C7 ACDF.

CTA head:

1. No intracranial large vessel occlusion or proximal high-grade
arterial stenosis.
2. Minimal non-stenotic calcified plaque within the paraclinoid left
ICA.
3. Mild/moderate focal stenosis within a superior division proximal
M2 right MCA vessel.

## 2022-07-29 ENCOUNTER — Ambulatory Visit
Admission: EM | Admit: 2022-07-29 | Discharge: 2022-07-29 | Disposition: A | Discharge status: Home / Self Care | Payer: BC Managed Care – PPO | Attending: Physician Assistant | Admitting: Physician Assistant

## 2022-07-29 DIAGNOSIS — R1012 Left upper quadrant pain: Secondary | ICD-10-CM

## 2022-07-29 LAB — POCT URINALYSIS DIP (MANUAL ENTRY)
Bilirubin, UA: NEGATIVE
Blood, UA: NEGATIVE
Glucose, UA: NEGATIVE mg/dL
Ketones, POC UA: NEGATIVE mg/dL
Nitrite, UA: NEGATIVE
Protein Ur, POC: NEGATIVE mg/dL
Spec Grav, UA: 1.02 (ref 1.010–1.025)
Urobilinogen, UA: 0.2 E.U./dL
pH, UA: 7 (ref 5.0–8.0)

## 2022-07-29 NOTE — ED Provider Notes (Signed)
[facility]    CSN: [id number] Arrival date & time: 07/29/22  0810      History   Chief Complaint Chief Complaint  Patient presents with   Rib Pain    HPI Dale Singh is a 57 y.o. male.   61-year-old male presents with abdominal pain.  Patient indicates that Friday he ate some boiled shrimp with his little spicy, along with some [place] fries and has puppies.  He relates that later on he started having some mild abdominal discomfort which was left upper quadrant.  He relates that it tended to be intermittent and was followed with increased stomach gurgling, and some loose stools.  He relates that over the past 3 days he has noticed the pain to be in the left upper quadrant, worse when he moves, turns, or bends.  He relates that the pain improves when he rubs on it.  He also relates that it tends to not be present when he is sitting stationary.  Patient indicates that he has not had any fever, chills, shortness of breath, the pain does not increase with breathing deep.  He relates he has not had any nausea, diarrhea, melena, or blood in the stool.  Patient indicates that he has not injured himself or traumatized the area that he is aware of.  He indicates there is no unusual rash on the skin.  Patient indicates that he is able to drink fluids and eat normally over the past several days.  He is just concerned about the discomfort and mentions that when he looks in the mirror there is some increased swelling on the left upper side.     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 Authorizing Provider  celecoxib (CELEBREX) 400 MG capsule Take by mouth. 12/28/19   [provider]  Cholecalciferol (VITAMIN D3) 5000 units TABS 5,000 IU OTC vitamin D3 daily. 07/08/17   Opalski, Neoma Laming, DO  meclizine (ANTIVERT) 25 MG tablet Take 1 tablet (25 mg total) by mouth 3 (three) times daily as needed for dizziness. 10/12/20   Tacy Learn, PA-C  ondansetron (ZOFRAN ODT) 4 MG disintegrating tablet Take 1 tablet (4 mg total) by mouth every 8 (eight) hours as needed for nausea or vomiting. 10/12/20   Tacy Learn, PA-C  Vitamin D, Ergocalciferol, (DRISDOL) 1.25 MG (50000 UNIT) CAPS capsule TAKE 1 CAPSULE BY MOUTH ONE TIME PER WEEK 11/03/19   Lorrene Reid, PA-C    Family History Family History  Problem Relation Age of Onset   Diabetes Mother    Diabetes Father     Social History Social History   Tobacco Use   Smoking status: Never   Smokeless tobacco: Never  Substance Use Topics  Alcohol use: Yes   Drug use: No     Allergies   Patient has no known allergies.   Review of Systems Review of Systems  Gastrointestinal:  Positive for abdominal pain (left upper quadrant).     Physical Exam Triage Vital Signs ED Triage Vitals  Enc Vitals Group     BP 07/29/22 0858 (!) 154/77     Pulse Rate 07/29/22 0858 70     Resp 07/29/22 0858 17     Temp 07/29/22 0858 98.9 F (37.2 C)     Temp Source 07/29/22 0858 Oral     SpO2 07/29/22 0858 98 %     Weight --      Height --      Head Circumference --      Peak Flow --      Pain Score 07/29/22 0859 5     Pain Loc --      Pain Edu? --      Excl. in [place]? --    No data found.  Updated Vital Signs BP (!) 154/77 (BP Location:  Left Arm)   Pulse 70   Temp 98.9 F (37.2 C) (Oral)   Resp 17   SpO2 98%   Visual Acuity Right Eye Distance:   Left Eye Distance:   Bilateral Distance:    Right Eye Near:   Left Eye Near:    Bilateral Near:     Physical Exam Constitutional:      Appearance: Normal appearance.  Cardiovascular:     Rate and Rhythm: Normal rate and regular rhythm.     Heart sounds: Normal heart sounds.  Pulmonary:     Effort: Pulmonary effort is normal.     Breath sounds: Normal breath sounds and air entry. No wheezing, rhonchi or rales.  Abdominal:     General: Abdomen is flat. Bowel sounds are increased.     Palpations: Abdomen is soft.     Tenderness: There is abdominal tenderness in the left upper quadrant. There is guarding (mild).       Comments: Abdomen: Tenderness is palpated at the left upper quadrant outer aspect 3 inches below the costal margin, no masses or organomegaly is noted.  Neurological:     Mental Status: He is alert.      UC Treatments / Results  Labs (all labs ordered are listed, but only abnormal results are displayed) Labs Reviewed  POCT URINALYSIS DIP (MANUAL ENTRY) - Abnormal; Notable for the following components:      Result Value   Color, UA light yellow (*)    Leukocytes, UA Trace (*)    All other components within normal limits  CBC  COMPREHENSIVE METABOLIC PANEL    EKG   Radiology No results found.  Procedures Procedures (including critical care time)  Medications Ordered in UC Medications - No data to display  Initial Impression / Assessment and Plan / UC Course  I have reviewed the triage vital signs and the nursing notes.  Pertinent labs & imaging results that were available during my care of the patient were reviewed by me and considered in my medical decision making (see chart for details).    Plan: The diagnosis will be treated with the following: 1.  Left upper quadrant abdominal pain: A.  CBC, and CMP lab results are  pending. B.  Patient advised to treat the symptoms with Tylenol or Motrin if needed, watchful waiting is advised to see if symptoms resolve over the next 3 to 4  days. 2.  Patient advised to follow-up PCP or return to urgent care if symptoms fail to improve over the next 3 to 4 days.  (Consider abdominal ultrasound) Final Clinical Impressions(s) / UC Diagnoses   Final diagnoses:  Left upper quadrant abdominal pain     Discharge Instructions      Complete blood count and CHEM screen will be completed within the next 48 hours.  If you do not get a call from this office that indicates those labs are normal.  Log onto MyChart to view the test results when they post in 24 to 48 hours.  Advised take Tylenol or ibuprofen as needed for pain relief.  Advised to observe if symptoms fail to improve within the next 48 to 72 hours or if symptoms increase and has followed with fever, nausea, changes in breathing, or other abnormalities then advised to return to be reevaluated at that time.    ED Prescriptions   None    PDMP not reviewed this encounter.   Ellsworth Lennox, PA-C 07/29/22 [phone]

## 2022-07-29 NOTE — Discharge Instructions (Addendum)
Complete blood count and CHEM screen will be completed within the next 48 hours.  If you do not get a call from this office that indicates those labs are normal.  Log onto MyChart to view the test results when they post in 24 to 48 hours.  Advised take Tylenol or ibuprofen as needed for pain relief.  Advised to observe if symptoms fail to improve within the next 48 to 72 hours or if symptoms increase and has followed with fever, nausea, changes in breathing, or other abnormalities then advised to return to be reevaluated at that time.

## 2022-07-29 NOTE — ED Triage Notes (Addendum)
Pt presents with left side rib pain X 3 days with no known falls or injuries.  Pt states he has no pain when there is no movement just pain with certain movements.

## 2022-07-30 LAB — CBC
Hematocrit: 46.5 % (ref 37.5–51.0)
Hemoglobin: 15.7 g/dL (ref 13.0–17.7)
MCH: 32.3 pg (ref 26.6–33.0)
MCHC: 33.8 g/dL (ref 31.5–35.7)
MCV: 96 fL (ref 79–97)
Platelets: 206 10*3/uL (ref 150–450)
RBC: 4.86 x10E6/uL (ref 4.14–5.80)
RDW: 12.2 % (ref 11.6–15.4)
WBC: 10.8 10*3/uL (ref 3.4–10.8)

## 2022-07-30 LAB — COMPREHENSIVE METABOLIC PANEL
ALT: 18 IU/L (ref 0–44)
AST: 16 IU/L (ref 0–40)
Albumin/Globulin Ratio: 2 (ref 1.2–2.2)
Albumin: 4.9 g/dL (ref 3.8–4.9)
Alkaline Phosphatase: 75 IU/L (ref 44–121)
BUN/Creatinine Ratio: 12 (ref 9–20)
BUN: 10 mg/dL (ref 6–24)
Bilirubin Total: 0.6 mg/dL (ref 0.0–1.2)
CO2: 22 mmol/L (ref 20–29)
Calcium: 9.8 mg/dL (ref 8.7–10.2)
Chloride: 101 mmol/L (ref 96–106)
Creatinine, Ser: 0.83 mg/dL (ref 0.76–1.27)
Globulin, Total: 2.5 g/dL (ref 1.5–4.5)
Glucose: 99 mg/dL (ref 70–99)
Potassium: 4.6 mmol/L (ref 3.5–5.2)
Sodium: 144 mmol/L (ref 134–144)
Total Protein: 7.4 g/dL (ref 6.0–8.5)
eGFR: 102 mL/min/{1.73_m2} (ref >59)

## 2023-04-09 ENCOUNTER — Other Ambulatory Visit: Payer: Self-pay | Admitting: Urology

## 2023-04-09 DIAGNOSIS — R972 Elevated prostate specific antigen [PSA]: Secondary | ICD-10-CM

## 2023-05-15 ENCOUNTER — Encounter: Payer: Self-pay | Admitting: Urology

## 2023-05-18 ENCOUNTER — Other Ambulatory Visit: Payer: BC Managed Care – PPO

## 2023-07-16 ENCOUNTER — Ambulatory Visit
Admission: RE | Admit: 2023-07-16 | Discharge: 2023-07-16 | Disposition: A | Discharge status: Home / Self Care | Payer: BC Managed Care – PPO | Source: Ambulatory Visit | Attending: Urology | Admitting: Urology

## 2023-07-16 DIAGNOSIS — R972 Elevated prostate specific antigen [PSA]: Secondary | ICD-10-CM

## 2023-07-16 MED ORDER — GADOPICLENOL 0.5 MMOL/ML IV SOLN
8.0000 mL | Freq: Once | INTRAVENOUS | Status: AC | PRN
  Administered 2023-07-16: 8 mL via INTRAVENOUS

## 2024-02-03 ENCOUNTER — Other Ambulatory Visit: Payer: Self-pay | Admitting: Urology

## 2024-05-10 NOTE — Patient Instructions (Addendum)
 SURGICAL WAITING ROOM VISITATION Patients having surgery or a procedure may have no more than 2 support people in the waiting area - these visitors may rotate in the visitor waiting room.   If the patient needs to stay at the hospital during part of their recovery, the visitor guidelines for inpatient rooms apply.  PRE-OP VISITATION  Pre-op nurse will coordinate an appropriate time for 1 support person to accompany the patient in pre-op.  This support person may not rotate.  This visitor will be contacted when the time is appropriate for the visitor to come back in the pre-op area.  Please refer to the [facility] website for the visitor guidelines for Inpatients (after your surgery is over and you are in a regular room).  You are not required to quarantine at this time prior to your surgery. However, you must do this: Hand Hygiene often Do NOT share personal items Notify your provider if you are in close contact with someone who has COVID or you develop fever 100.4 or greater, new onset of sneezing, cough, sore throat, shortness of breath or body aches.  If you test positive for Covid or have been in contact with anyone that has tested positive in the last 10 days please notify you surgeon.    Your procedure is scheduled on:  MONDAY  May 24, 2024  Report to [facility] Main Entrance: [address] entrance where the [employer] is available.   Report to admitting at:   05:15  AM  Call this number if you have any questions or problems the morning of surgery [phone]  DO NOT EAT OR DRINK ANYTHING AFTER MIDNIGHT THE NIGHT PRIOR TO YOUR SURGERY / PROCEDURE.   FOLLOW  ANY ADDITIONAL PRE OP INSTRUCTIONS YOU RECEIVED FROM YOUR SURGEON'S OFFICE!!!  MAGNESIUM CITRATE:  Obtain one (1)  bottle (8 oz) of Magnesium Citrate at your pharmacy. Drink entire bottle at 12:00 noon the day before your surgery/ procedure.  FLEET ENEMA: Obtain one(1) Fleet Enema (sodium phosphate 7-19 gm / 118  ml enema) and use (according to the directions on the box) the night prior to your surgery.   If you have any questions, please contact your Surgeon's office for additional information.    Oral Hygiene is also important to reduce your risk of infection.        Remember - BRUSH YOUR TEETH THE MORNING OF SURGERY WITH YOUR REGULAR TOOTHPASTE  Do NOT smoke after Midnight the night before surgery.  STOP TAKING all Vitamins, Herbs and supplements 1 week before your surgery.   Take ONLY these medicines the morning of surgery with A SIP OF WATER: Tamsulosin,  Tylenol???   If You have been diagnosed with Sleep Apnea - Bring CPAP mask and tubing day of surgery. We will provide you with a CPAP machine on the day of your surgery.                   You may not have any metal on your body including  jewelry, and body piercing  Do not wear  lotions, powders,  cologne, or deodorant  Men may shave face and neck.  Contacts, Hearing Aids, dentures or bridgework may not be worn into surgery. DENTURES WILL BE REMOVED PRIOR TO SURGERY PLEASE DO NOT APPLY Poly grip OR ADHESIVES!!!  You may bring a small overnight bag with you on the day of surgery, only pack items that are not valuable. [facility] IS NOT RESPONSIBLE   FOR VALUABLES  THAT ARE LOST OR STOLEN.   Do not bring your home medications to the hospital. The Pharmacy will dispense medications listed on your medication list to you during your admission in the Hospital.  Special Instructions: Bring a copy of your healthcare power of attorney and living will documents the day of surgery, if you wish to have them scanned into your [facility] Records- EPIC  Please read over the following fact sheets you were given: IF YOU HAVE QUESTIONS ABOUT YOUR PRE-OP INSTRUCTIONS, PLEASE CALL [phone].   [place] - Preparing for Surgery      Before surgery, you can play an important role.  Because skin is not sterile, your skin needs to be as free of  germs as possible.  You can reduce the number of germs on your skin by washing with CHG (chlorahexidine gluconate) soap before surgery.  CHG is an antiseptic cleaner which kills germs and bonds with the skin to continue killing germs even after washing. Please DO NOT use if you have an allergy to CHG or antibacterial soaps.  If your skin becomes reddened/irritated stop using the CHG and inform your nurse when you arrive at Short Stay. Do not shave (including legs and underarms) for at least 48 hours prior to the first CHG shower.  You may shave your face/neck.  Please follow these instructions carefully:  1.  Shower with CHG Soap the night before surgery ONLY (DO NOT USE THE CHG SOAP THE MORNING OF SURGERY).  2.  If you choose to wash your hair, wash your hair first as usual with your normal  shampoo.  3.  After you shampoo, rinse your hair and body thoroughly to remove the shampoo.                             4.  Use CHG as you would any other liquid soap.  You can apply chg directly to the skin and wash.  Gently with a scrungie or clean washcloth.  5.  Apply the CHG Soap to your body ONLY FROM THE NECK DOWN.   Do not use on face/ open                           Wound or open sores. Avoid contact with eyes, ears mouth and genitals (private parts).                       Wash face,  Genitals (private parts) with your normal soap.             6.  Wash thoroughly, paying special attention to the area where your  surgery  will be performed.  7.  Thoroughly rinse your body with warm water from the neck down.  8.  DO NOT shower/wash with your normal soap after using and rinsing off the CHG Soap.                9.  Pat yourself dry with a clean towel.            10.  Wear clean pajamas.            11.  Place clean sheets on your bed the night of your first shower and do not  sleep with pets.  Day of Surgery : Do not apply any CHG, lotions/deodorants the morning of surgery.  Please wear clean clothes to  the hospital/surgery center.   FAILURE TO FOLLOW THESE INSTRUCTIONS MAY RESULT IN THE CANCELLATION OF YOUR SURGERY  PATIENT SIGNATURE_________________________________  NURSE SIGNATURE__________________________________  ________________________________________________________________________

## 2024-05-10 NOTE — Progress Notes (Signed)
 COVID Vaccine received:  [x]  No []  Yes Date of any COVID positive Test in last 90 days: none  PCP - Bernardino Boone, DO at [facility]   LOV 12-25-23 Cardiologist - None  Chest x-ray -  EKG -  05-12-2024 Stress Test -  ECHO -  Cardiac Cath -  CT Coronary Calcium score:   Bowel Prep - []  No  [x]   Yes _Mag Citrate / Fleets  Pacemaker / ICD device [x]  No []  Yes   Spinal Cord Stimulator: [x]  No []  Yes       History of Sleep Apnea? []  No [x]  Yes   CPAP used?- []  No []  Yes    Patient has: []  NO Hx DM   [x]  Pre-DM   []  DM1  []   DM2 Does the patient monitor blood sugar?   []  N/A   [x]  No []  Yes  Last A1c was: 5.7   on  12-25-23     Blood Thinner / Instructions:  none Aspirin Instructions:  none  Activity level: Able to walk up 2 flights of stairs without becoming significantly short of breath or having chest pain?  []  No   [x]    Yes  Patient can perform ADLs without assistance. []  No   [x]   Yes  Anesthesia review: Pre-DM, HTN, OSA-CPAP, asthma, s/p ACDF  C5-7 done in 2000, hard to wake up  Patient denies any S&S of respiratory illness or Covid - no shortness of breath, fever, cough or chest pain at PAT appointment.  Patient verbalized understanding and agreement to the Pre-Surgical Instructions that were given to them at this PAT appointment. Patient was also educated of the need to review these PAT instructions again prior to his surgery.I reviewed the appropriate phone numbers to call if they have any and questions or concerns.

## 2024-05-12 ENCOUNTER — Encounter (HOSPITAL_COMMUNITY)
Admission: RE | Admit: 2024-05-12 | Discharge: 2024-05-12 | Disposition: A | Discharge status: Home / Self Care | Source: Ambulatory Visit | Attending: Urology | Admitting: Urology

## 2024-05-12 ENCOUNTER — Other Ambulatory Visit: Payer: Self-pay

## 2024-05-12 ENCOUNTER — Encounter (HOSPITAL_COMMUNITY): Payer: Self-pay

## 2024-05-12 VITALS — BP 122/80 | HR 69 | Temp 98.6°F | Resp 16 | Ht 65.0 in | Wt 188.0 lb

## 2024-05-12 DIAGNOSIS — I10 Essential (primary) hypertension: Secondary | ICD-10-CM | POA: Insufficient documentation

## 2024-05-12 DIAGNOSIS — Z01818 Encounter for other preprocedural examination: Secondary | ICD-10-CM | POA: Insufficient documentation

## 2024-05-12 DIAGNOSIS — R7303 Prediabetes: Secondary | ICD-10-CM | POA: Insufficient documentation

## 2024-05-12 HISTORY — DX: Dizziness and giddiness: R42

## 2024-05-12 HISTORY — DX: Malignant (primary) neoplasm, unspecified: C80.1

## 2024-05-12 HISTORY — DX: Sleep apnea, unspecified: G47.30

## 2024-05-12 HISTORY — DX: Essential (primary) hypertension: I10

## 2024-05-12 HISTORY — DX: Prediabetes: R73.03

## 2024-05-12 HISTORY — DX: Myoneural disorder, unspecified: G70.9

## 2024-05-12 HISTORY — DX: Other complications of anesthesia, initial encounter: T88.59XA

## 2024-05-12 LAB — TYPE AND SCREEN
ABO/RH(D): A POS
Antibody Screen: NEGATIVE

## 2024-05-12 LAB — BASIC METABOLIC PANEL WITH GFR
Anion gap: 11 (ref 5–15)
BUN: 23 mg/dL — ABNORMAL HIGH (ref 6–20)
CO2: 25 mmol/L (ref 22–32)
Calcium: 9.7 mg/dL (ref 8.9–10.3)
Chloride: 104 mmol/L (ref 98–111)
Creatinine, Ser: 0.84 mg/dL (ref 0.61–1.24)
GFR, Estimated: >60 mL/min (ref >60)
Glucose, Bld: 116 mg/dL — ABNORMAL HIGH (ref 70–99)
Potassium: 4.5 mmol/L (ref 3.5–5.1)
Sodium: 140 mmol/L (ref 135–145)

## 2024-05-12 LAB — CBC
HCT: 45.4 % (ref 39.0–52.0)
Hemoglobin: 15.4 g/dL (ref 13.0–17.0)
MCH: 32.2 pg (ref 26.0–34.0)
MCHC: 33.9 g/dL (ref 30.0–36.0)
MCV: 94.8 fL (ref 80.0–100.0)
Platelets: 207 K/uL (ref 150–400)
RBC: 4.79 MIL/uL (ref 4.22–5.81)
RDW: 12.5 % (ref 11.5–15.5)
WBC: 7.7 K/uL (ref 4.0–10.5)
nRBC: 0 % (ref 0.0–0.2)

## 2024-05-22 NOTE — Anesthesia Preprocedure Evaluation (Signed)
 Anesthesia Evaluation  Patient identified by MRN, date of birth, ID band Patient awake    Reviewed: Allergy & Precautions, NPO status , Patient's Chart, lab work & pertinent test results  History of Anesthesia Complications Negative for: history of anesthetic complications  Airway Mallampati: II  TM Distance: >3 FB Neck ROM: Full    Dental no notable dental hx. (+) Teeth Intact, Dental Advisory Given   Pulmonary asthma , sleep apnea and Continuous Positive Airway Pressure Ventilation    Pulmonary exam normal breath sounds clear to auscultation       Cardiovascular hypertension, (-) angina (-) Past MI Normal cardiovascular exam Rhythm:Regular Rate:Normal     Neuro/Psych  Neuromuscular disease negative neurological ROS  negative psych ROS   GI/Hepatic   Endo/Other    Renal/GU Lab Results      Component                Value               Date                     K                        4.5                 05/12/2024                CO2                      25                  05/12/2024                BUN                      23 (H)              05/12/2024                CREATININE               0.84                05/12/2024                    Musculoskeletal  (+) Arthritis ,    Abdominal   Peds  Hematology Lab Results      Component                Value               Date                      WBC                      7.7                 05/12/2024                HGB                      15.4                05/12/2024                HCT  45.4                05/12/2024                MCV                      94.8                05/12/2024                PLT                      207                 05/12/2024              Anesthesia Other Findings hx prostate CA  Reproductive/Obstetrics                              Anesthesia Physical Anesthesia Plan  ASA:  3  Anesthesia Plan: General   Post-op Pain Management: Lidocaine  infusion*, Ofirmev  IV (intra-op)* and Ketamine  IV*   Induction: Intravenous  PONV Risk Score and Plan: 4 or greater and Treatment may vary due to age or medical condition, Midazolam , Dexamethasone  and Ondansetron   Airway Management Planned: Oral ETT and Video Laryngoscope Planned  Additional Equipment: None  Intra-op Plan:   Post-operative Plan: Extubation in OR  Informed Consent: I have reviewed the patients History and Physical, chart, labs and discussed the procedure including the risks, benefits and alternatives for the proposed anesthesia with the patient or authorized representative who has indicated his/her understanding and acceptance.     Dental advisory given  Plan Discussed with: CRNA and Anesthesiologist  Anesthesia Plan Comments:          Anesthesia Quick Evaluation

## 2024-05-24 ENCOUNTER — Ambulatory Visit (HOSPITAL_COMMUNITY): Payer: Self-pay | Admitting: Anesthesiology

## 2024-05-24 ENCOUNTER — Encounter (HOSPITAL_COMMUNITY): Payer: Self-pay | Admitting: Urology

## 2024-05-24 ENCOUNTER — Encounter (HOSPITAL_COMMUNITY)
Admission: RE | Disposition: A | Discharge status: Home / Self Care | Payer: Self-pay | Source: Home / Self Care | Attending: Urology

## 2024-05-24 ENCOUNTER — Observation Stay (HOSPITAL_COMMUNITY)
Admission: RE | Admit: 2024-05-24 | Discharge: 2024-05-25 | Disposition: A | Discharge status: Home / Self Care | Attending: Urology | Admitting: Urology

## 2024-05-24 ENCOUNTER — Ambulatory Visit (HOSPITAL_COMMUNITY): Payer: Self-pay | Admitting: Physician Assistant

## 2024-05-24 ENCOUNTER — Other Ambulatory Visit: Payer: Self-pay

## 2024-05-24 DIAGNOSIS — I10 Essential (primary) hypertension: Secondary | ICD-10-CM

## 2024-05-24 DIAGNOSIS — C61 Malignant neoplasm of prostate: Secondary | ICD-10-CM

## 2024-05-24 DIAGNOSIS — J45909 Unspecified asthma, uncomplicated: Secondary | ICD-10-CM

## 2024-05-24 DIAGNOSIS — G4733 Obstructive sleep apnea (adult) (pediatric): Secondary | ICD-10-CM

## 2024-05-24 HISTORY — PX: ROBOT ASSISTED LAPAROSCOPIC RADICAL PROSTATECTOMY: SHX5141

## 2024-05-24 LAB — BASIC METABOLIC PANEL WITH GFR
Anion gap: 12 (ref 5–15)
BUN: 17 mg/dL (ref 6–20)
CO2: 23 mmol/L (ref 22–32)
Calcium: 8.4 mg/dL — ABNORMAL LOW (ref 8.9–10.3)
Chloride: 105 mmol/L (ref 98–111)
Creatinine, Ser: 1.04 mg/dL (ref 0.61–1.24)
GFR, Estimated: >60 mL/min
Glucose, Bld: 149 mg/dL — ABNORMAL HIGH (ref 70–99)
Potassium: 4 mmol/L (ref 3.5–5.1)
Sodium: 140 mmol/L (ref 135–145)

## 2024-05-24 LAB — ABO/RH: ABO/RH(D): A POS

## 2024-05-24 LAB — HEMOGLOBIN AND HEMATOCRIT, BLOOD
HCT: 41.5 % (ref 39.0–52.0)
Hemoglobin: 14 g/dL (ref 13.0–17.0)

## 2024-05-24 LAB — HIV ANTIBODY (ROUTINE TESTING W REFLEX): HIV Screen 4th Generation wRfx: NONREACTIVE

## 2024-05-24 SURGERY — PROSTATECTOMY, RADICAL, ROBOT-ASSISTED, LAPAROSCOPIC
Anesthesia: General

## 2024-05-24 MED ORDER — OXYCODONE HCL 5 MG PO TABS: 5.0000 mg | ORAL_TABLET | Freq: Once | ORAL | Status: DC | PRN

## 2024-05-24 MED ORDER — ACETAMINOPHEN 10 MG/ML IV SOLN: 1000.0000 mg | Freq: Once | INTRAVENOUS | Status: DC | PRN

## 2024-05-24 MED ORDER — AMISULPRIDE (ANTIEMETIC) 5 MG/2ML IV SOLN: 10.0000 mg | Freq: Once | INTRAVENOUS | Status: DC | PRN

## 2024-05-24 MED ORDER — CEFAZOLIN SODIUM 1 G IJ SOLR
INTRAMUSCULAR | Status: AC
Start: 2024-05-24 — End: 2024-05-24
  Filled 2024-05-24: qty 20

## 2024-05-24 MED ORDER — CHLORHEXIDINE GLUCONATE 0.12 % MT SOLN
15.0000 mL | Freq: Once | OROMUCOSAL | Status: AC
  Administered 2024-05-24: 15 mL via OROMUCOSAL

## 2024-05-24 MED ORDER — ONDANSETRON HCL 4 MG/2ML IJ SOLN
INTRAMUSCULAR | Status: DC | PRN
  Administered 2024-05-24 (×2): 4 mg via INTRAVENOUS

## 2024-05-24 MED ORDER — STERILE WATER FOR IRRIGATION IR SOLN
Status: DC | PRN
  Administered 2024-05-24: 1000 mL

## 2024-05-24 MED ORDER — PHENYLEPHRINE 80 MCG/ML (10ML) SYRINGE FOR IV PUSH (FOR BLOOD PRESSURE SUPPORT)
PREFILLED_SYRINGE | INTRAVENOUS | Status: AC
  Filled 2024-05-24: qty 10

## 2024-05-24 MED ORDER — BUPIVACAINE LIPOSOME 1.3 % IJ SUSP
INTRAMUSCULAR | Status: DC | PRN
  Administered 2024-05-24: 20 mL

## 2024-05-24 MED ORDER — ROCURONIUM BROMIDE 10 MG/ML (PF) SYRINGE
PREFILLED_SYRINGE | INTRAVENOUS | Status: AC
  Filled 2024-05-24: qty 10

## 2024-05-24 MED ORDER — ACETAMINOPHEN 500 MG PO TABS
1000.0000 mg | ORAL_TABLET | Freq: Three times a day (TID) | ORAL | Status: DC
  Administered 2024-05-24 – 2024-05-25 (×3): 1000 mg via ORAL
  Filled 2024-05-24 (×3): qty 2

## 2024-05-24 MED ORDER — LIDOCAINE HCL (PF) 2 % IJ SOLN
INTRAMUSCULAR | Status: AC
  Filled 2024-05-24: qty 15

## 2024-05-24 MED ORDER — ROCURONIUM BROMIDE 100 MG/10ML IV SOLN
INTRAVENOUS | Status: DC | PRN
  Administered 2024-05-24: 15 mg via INTRAVENOUS
  Administered 2024-05-24: 60 mg via INTRAVENOUS
  Administered 2024-05-24 (×4): 20 mg via INTRAVENOUS
  Administered 2024-05-24: 15 mg via INTRAVENOUS

## 2024-05-24 MED ORDER — OXYCODONE-ACETAMINOPHEN 5-325 MG PO TABS: 1.0000 | ORAL_TABLET | ORAL | 0 refills | Status: AC | PRN

## 2024-05-24 MED ORDER — CEFAZOLIN SODIUM-DEXTROSE 1-4 GM/50ML-% IV SOLN
1.0000 g | Freq: Three times a day (TID) | INTRAVENOUS | Status: AC
  Administered 2024-05-24 – 2024-05-25 (×2): 1 g via INTRAVENOUS
  Filled 2024-05-24 (×2): qty 50

## 2024-05-24 MED ORDER — FLEET ENEMA RE ENEM: 1.0000 | ENEMA | Freq: Once | RECTAL | Status: DC

## 2024-05-24 MED ORDER — KETAMINE HCL 50 MG/5ML IJ SOSY
PREFILLED_SYRINGE | INTRAMUSCULAR | Status: AC
  Filled 2024-05-24: qty 5

## 2024-05-24 MED ORDER — SUGAMMADEX SODIUM 200 MG/2ML IV SOLN
INTRAVENOUS | Status: DC | PRN
  Administered 2024-05-24: 200 mg via INTRAVENOUS

## 2024-05-24 MED ORDER — CEFAZOLIN SODIUM-DEXTROSE 2-4 GM/100ML-% IV SOLN
2.0000 g | INTRAVENOUS | Status: AC
  Administered 2024-05-24 (×2): 2 g via INTRAVENOUS
  Filled 2024-05-24: qty 100

## 2024-05-24 MED ORDER — KETOROLAC TROMETHAMINE 15 MG/ML IJ SOLN
15.0000 mg | Freq: Four times a day (QID) | INTRAMUSCULAR | Status: DC
  Administered 2024-05-24 – 2024-05-25 (×3): 15 mg via INTRAVENOUS
  Filled 2024-05-24 (×4): qty 1

## 2024-05-24 MED ORDER — SODIUM CHLORIDE (PF) 0.9 % IJ SOLN
INTRAMUSCULAR | Status: AC
  Filled 2024-05-24: qty 20

## 2024-05-24 MED ORDER — HYDROMORPHONE HCL 1 MG/ML IJ SOLN
INTRAMUSCULAR | Status: AC
  Filled 2024-05-24: qty 1

## 2024-05-24 MED ORDER — BUPIVACAINE LIPOSOME 1.3 % IJ SUSP
INTRAMUSCULAR | Status: AC
Start: 2024-05-24 — End: 2024-05-24
  Filled 2024-05-24: qty 20

## 2024-05-24 MED ORDER — DEXAMETHASONE SOD PHOSPHATE PF 10 MG/ML IJ SOLN
INTRAMUSCULAR | Status: DC | PRN
  Administered 2024-05-24: 8 mg via INTRAVENOUS

## 2024-05-24 MED ORDER — SULFAMETHOXAZOLE-TRIMETHOPRIM 800-160 MG PO TABS: 1.0000 | ORAL_TABLET | Freq: Two times a day (BID) | ORAL | 0 refills | Status: AC

## 2024-05-24 MED ORDER — MIDAZOLAM HCL (PF) 2 MG/2ML IJ SOLN
INTRAMUSCULAR | Status: DC | PRN
  Administered 2024-05-24: 2 mg via INTRAVENOUS

## 2024-05-24 MED ORDER — LACTATED RINGERS IV SOLN: INTRAVENOUS | Status: DC

## 2024-05-24 MED ORDER — SODIUM CHLORIDE 0.9 % IV SOLN: INTRAVENOUS | Status: DC | PRN

## 2024-05-24 MED ORDER — HYDROMORPHONE HCL 1 MG/ML IJ SOLN
0.2500 mg | INTRAMUSCULAR | Status: DC | PRN
  Administered 2024-05-24: 0.5 mg via INTRAVENOUS

## 2024-05-24 MED ORDER — HEMOSTATIC AGENTS (NO CHARGE) OPTIME
TOPICAL | Status: DC | PRN
  Administered 2024-05-24: 1 via TOPICAL
  Administered 2024-05-24: 1

## 2024-05-24 MED ORDER — SUGAMMADEX SODIUM 200 MG/2ML IV SOLN
INTRAVENOUS | Status: AC
  Filled 2024-05-24: qty 2

## 2024-05-24 MED ORDER — OXYCODONE HCL 5 MG/5ML PO SOLN: 5.0000 mg | Freq: Once | ORAL | Status: DC | PRN

## 2024-05-24 MED ORDER — MIDAZOLAM HCL 2 MG/2ML IJ SOLN
INTRAMUSCULAR | Status: AC
  Filled 2024-05-24: qty 2

## 2024-05-24 MED ORDER — ONDANSETRON HCL 4 MG/2ML IJ SOLN: 4.0000 mg | Freq: Once | INTRAMUSCULAR | Status: DC | PRN

## 2024-05-24 MED ORDER — PROPOFOL 10 MG/ML IV BOLUS
INTRAVENOUS | Status: DC | PRN
  Administered 2024-05-24: 150 mg via INTRAVENOUS

## 2024-05-24 MED ORDER — KETAMINE HCL 50 MG/5ML IJ SOSY
PREFILLED_SYRINGE | INTRAMUSCULAR | Status: DC | PRN
  Administered 2024-05-24 (×2): 20 mg via INTRAVENOUS

## 2024-05-24 MED ORDER — ORAL CARE MOUTH RINSE: 15.0000 mL | Freq: Once | OROMUCOSAL | Status: AC

## 2024-05-24 MED ORDER — HYDROMORPHONE HCL 1 MG/ML IJ SOLN: 0.5000 mg | INTRAMUSCULAR | Status: DC | PRN

## 2024-05-24 MED ORDER — OXYCODONE HCL 5 MG PO TABS
5.0000 mg | ORAL_TABLET | ORAL | Status: DC | PRN
  Administered 2024-05-24 – 2024-05-25 (×3): 5 mg via ORAL
  Filled 2024-05-24 (×3): qty 1

## 2024-05-24 MED ORDER — POLYETHYLENE GLYCOL 3350 17 G PO PACK: 17.0000 g | PACK | Freq: Every day | ORAL | Status: DC | PRN

## 2024-05-24 MED ORDER — FENTANYL CITRATE (PF) 100 MCG/2ML IJ SOLN
INTRAMUSCULAR | Status: AC
  Filled 2024-05-24: qty 2

## 2024-05-24 MED ORDER — FENTANYL CITRATE (PF) 250 MCG/5ML IJ SOLN
INTRAMUSCULAR | Status: DC | PRN
Start: 2024-05-24 — End: 2024-05-24
  Administered 2024-05-24 (×2): 25 ug via INTRAVENOUS
  Administered 2024-05-24 (×2): 50 ug via INTRAVENOUS
  Administered 2024-05-24: 25 ug via INTRAVENOUS
  Administered 2024-05-24 (×2): 50 ug via INTRAVENOUS
  Administered 2024-05-24: 25 ug via INTRAVENOUS

## 2024-05-24 MED ORDER — LIDOCAINE HCL (PF) 2 % IJ SOLN
INTRAMUSCULAR | Status: DC | PRN
  Administered 2024-05-24: 100 mg via INTRADERMAL

## 2024-05-24 MED ORDER — SODIUM CHLORIDE 0.9% FLUSH
INTRAVENOUS | Status: DC | PRN
  Administered 2024-05-24: 20 mL

## 2024-05-24 MED ORDER — MAGNESIUM CITRATE PO SOLN: 1.0000 | Freq: Once | ORAL | Status: DC

## 2024-05-24 MED ORDER — PHENYLEPHRINE 80 MCG/ML (10ML) SYRINGE FOR IV PUSH (FOR BLOOD PRESSURE SUPPORT)
PREFILLED_SYRINGE | INTRAVENOUS | Status: DC | PRN
  Administered 2024-05-24 (×2): 120 ug via INTRAVENOUS

## 2024-05-24 MED ORDER — LACTATED RINGERS IR SOLN
Status: DC | PRN
  Administered 2024-05-24: 1

## 2024-05-24 MED ORDER — PHENYLEPHRINE HCL-NACL 20-0.9 MG/250ML-% IV SOLN: INTRAVENOUS | Status: DC | PRN

## 2024-05-24 MED ORDER — PROPOFOL 10 MG/ML IV BOLUS
INTRAVENOUS | Status: AC
  Filled 2024-05-24: qty 20

## 2024-05-24 MED ORDER — PHENYLEPHRINE HCL-NACL 20-0.9 MG/250ML-% IV SOLN
INTRAVENOUS | Status: DC | PRN
  Administered 2024-05-24: 15 ug/min via INTRAVENOUS

## 2024-05-24 MED ORDER — FENTANYL CITRATE (PF) 250 MCG/5ML IJ SOLN
INTRAMUSCULAR | Status: AC
  Filled 2024-05-24: qty 5

## 2024-05-24 SURGICAL SUPPLY — 59 items
APPLICATOR COTTON TIP 6 STRL (MISCELLANEOUS) ×2 IMPLANT
APPLICATOR SURGIFLO ENDO (HEMOSTASIS) IMPLANT
BAG COUNTER SPONGE SURGICOUNT (BAG) IMPLANT
CATH FOLEY 2WAY SLVR 5CC 18FR (CATHETERS) ×2 IMPLANT
CATH ROBINSON RED A/P 16FR (CATHETERS) ×2 IMPLANT
CATH SILICONE 5CC 18FR (INSTRUMENTS) ×2 IMPLANT
CHLORAPREP W/TINT 26 (MISCELLANEOUS) ×2 IMPLANT
CLIP LIGATING HEM O LOK PURPLE (MISCELLANEOUS) ×2 IMPLANT
COVER SURGICAL LIGHT HANDLE (MISCELLANEOUS) ×2 IMPLANT
COVER TIP SHEARS 8 DVNC (MISCELLANEOUS) ×2 IMPLANT
CUTTER ECHEON FLEX ENDO 45 340 (ENDOMECHANICALS) ×2 IMPLANT
DERMABOND ADVANCED .7 DNX12 (GAUZE/BANDAGES/DRESSINGS) ×2 IMPLANT
DEVICE STATLOCK FOLEY SWIVEL S (MISCELLANEOUS) ×2 IMPLANT
DRAPE ARM DVNC X/XI (DISPOSABLE) ×8 IMPLANT
DRAPE COLUMN DVNC XI (DISPOSABLE) ×2 IMPLANT
DRAPE SURG IRRIG POUCH 19X23 (DRAPES) ×2 IMPLANT
DRIVER NDL LRG 8 DVNC XI (INSTRUMENTS) ×4 IMPLANT
DRSG TEGADERM 4X4.75 (GAUZE/BANDAGES/DRESSINGS) ×2 IMPLANT
ELECT PENCIL ROCKER SW 15FT (MISCELLANEOUS) ×2 IMPLANT
ELECT REM PT RETURN 15FT ADLT (MISCELLANEOUS) ×2 IMPLANT
FORCEPS BPLR 8 MD DVNC XI (FORCEP) ×2 IMPLANT
FORCEPS BPLR FENES DVNC XI (FORCEP) ×2 IMPLANT
FORCEPS PROGRASP DVNC XI (FORCEP) ×2 IMPLANT
GAUZE 4X4 16PLY ~~LOC~~+RFID DBL (SPONGE) IMPLANT
GAUZE SPONGE 4X4 12PLY STRL (GAUZE/BANDAGES/DRESSINGS) ×2 IMPLANT
GLOVE BIO SURGEON STRL SZ 6.5 (GLOVE) ×2 IMPLANT
GLOVE BIOGEL M 7.0 STRL (GLOVE) ×4 IMPLANT
GLOVE BIOGEL PI IND STRL 7.5 (GLOVE) ×4 IMPLANT
GOWN STRL REUS W/ TWL XL LVL3 (GOWN DISPOSABLE) ×4 IMPLANT
GOWN STRL SURGICAL XL XLNG (GOWN DISPOSABLE) ×2 IMPLANT
HEMOSTAT SURGICEL 4X8 (HEMOSTASIS) IMPLANT
IRRIGATION SUCT STRKRFLW 2 WTP (MISCELLANEOUS) ×2 IMPLANT
IV LACTATED RINGERS 1000ML (IV SOLUTION) ×2 IMPLANT
KIT TURNOVER KIT A (KITS) ×2 IMPLANT
MARKER SKIN DUAL TIP RULER LAB (MISCELLANEOUS) ×2 IMPLANT
NDL INSUFFLATION 14GA 120MM (NEEDLE) ×2 IMPLANT
PACK ROBOT UROLOGY CUSTOM (CUSTOM PROCEDURE TRAY) ×2 IMPLANT
PLUG CATH AND CAP STRL 200 (CATHETERS) IMPLANT
PROTECTOR NERVE ULNAR (MISCELLANEOUS) ×2 IMPLANT
RELOAD STAPLE 45 4.1 GRN THCK (STAPLE) ×2 IMPLANT
SCISSORS LAP 5X45 EPIX DISP (ENDOMECHANICALS) IMPLANT
SCISSORS MNPLR CVD DVNC XI (INSTRUMENTS) ×2 IMPLANT
SEAL UNIV 5-12 XI (MISCELLANEOUS) ×8 IMPLANT
SET TUBE SMOKE EVAC HIGH FLOW (TUBING) ×2 IMPLANT
SOL PREP POV-IOD 4OZ 10% (MISCELLANEOUS) ×2 IMPLANT
SOLUTION ELECTROSURG ANTI STCK (MISCELLANEOUS) ×2 IMPLANT
SURGIFLO W/THROMBIN 8M KIT (HEMOSTASIS) IMPLANT
SUT ETHILON 2 0 PS N (SUTURE) ×2 IMPLANT
SUT MNCRL 3 0 VIOLET RB1 (SUTURE) IMPLANT
SUT MNCRL AB 4-0 PS2 18 (SUTURE) ×4 IMPLANT
SUT NOVA NAB DX-16 0-1 5-0 T12 (SUTURE) IMPLANT
SUT PDS AB 0 CT1 36 (SUTURE) ×4 IMPLANT
SUT VIC AB 0 CT1 27XBRD ANTBC (SUTURE) ×2 IMPLANT
SUT VIC AB 2-0 SH 27XBRD (SUTURE) ×2 IMPLANT
SUT VIC AB 3-0 SH 27X BRD (SUTURE) IMPLANT
SUT VIC AB 4-0 RB1 27XBRD (SUTURE) ×2 IMPLANT
SUT VLOC 3-0 9IN GRN (SUTURE) IMPLANT
SUTURE VLOC BRB 180 ABS3/0GR12 (SUTURE) ×2 IMPLANT
WATER STERILE IRR 1000ML POUR (IV SOLUTION) ×2 IMPLANT

## 2024-05-24 NOTE — Discharge Instructions (Signed)
   Activity:  You are encouraged to ambulate frequently (about every hour during waking hours) to help prevent blood clots from forming in your legs or lungs.  However, you should not engage in any heavy lifting (> 10-15 lbs), strenuous activity, or straining.   Diet: You should advance your diet as instructed by your physician.  It will be normal to have some bloating, nausea, and abdominal discomfort intermittently.   Prescriptions:  You will be provided a prescription for pain medication to take as needed.  If your pain is not severe enough to require the prescription pain medication, you may take extra strength Tylenol instead which will have less side effects.  You should also take a prescribed stool softener to avoid straining with bowel movements as the prescription pain medication may constipate you.   Incisions: You may remove your dressing bandages 48 hours after surgery if not removed in the hospital.  You will either have some small staples or special tissue glue at each of the incision sites. Once the bandages are removed (if present), the incisions may stay open to air.  You may start showering (but not soaking or bathing in water) the 2nd day after surgery and the incisions simply need to be patted dry after the shower.  No additional care is needed.   What to call us about: You should call the office [phone]) if you develop fever > 101 or develop persistent vomiting. Activity:  You are encouraged to ambulate frequently (about every hour during waking hours) to help prevent blood clots from forming in your legs or lungs.  However, you should not engage in any heavy lifting (> 10-15 lbs), strenuous activity, or straining.

## 2024-05-24 NOTE — Progress Notes (Signed)
   05/24/24 2148  BiPAP/CPAP/SIPAP  $ Non-Invasive Home Ventilator  Initial  BiPAP/CPAP/SIPAP Pt Type Adult  BiPAP/CPAP/SIPAP Resmed  Mask Type Nasal mask  Dentures removed? Not applicable  Respiratory Rate 16 breaths/min  FiO2 (%) 21 %  Patient Home Machine No  Patient Home Mask Yes  Patient Home Tubing Yes  Auto Titrate Yes  Minimum cmH2O 5 cmH2O  Maximum cmH2O 20 cmH2O  Device Plugged into [employer] Power Outlet Yes

## 2024-05-24 NOTE — Op Note (Signed)
 Operative Note  Preoperative diagnosis:  1.  Localized prostate cancer  Postoperative diagnosis: 1.  Localized prostate cancer  Procedure(s): 1.  Robotic assisted laparoscopic radical prostatectomy (bilateral nerve sparing) 2.  Robotic assisted laparoscopic bilateral pelvic lymph node dissection 3. Umbilical hernia repair  Surgeon: Donnice Siad, MD  Resident: Maurilio Agar, PGY-4  An assistant was required for this surgical procedure.  The duties of the assistant included but were not limited to suctioning, passing suture, camera manipulation, retraction.  This procedure would not be able to be performed without an geophysicist/field seismologist.   Anesthesia:  General  Complications:  None  EBL:   Specimens: 1.  Prostate with seminal vesicles 2.  Periprostatic fat 3. Bilateral pelvic lymph nodes  Drains/Catheters: 1.  18 French Foley catheter  Intraoperative findings:   Approximately 50cc prostate.  No accessory pudendal vessels.  Successful bilateral nerve spare.  Excellent hemostasis.  Water -tight anastomosis without leak.  Indication:  Dale Singh is a 58 y.o. malewho initially presented with an elevated PSA.  Prostate biopsy showed Gleason 3+4=7 prostate cancer with cancer involving bilateral sides of the prostate.  Treatment options were discussed with him at length and he chose robotic assisted laparoscopic radical prostatectomy.  The plan is for bilateral nerve sparing.  Bilateral pelvic lymph node dissection was planned due to his risk stratification.  Description of procedure: The indications, alternatives, benefits, and risks were discussed with the patient and informed symptoms obtained.  The patient was brought to the operating room table, positioned supine and secured to the bed with a safety strap.  All pressure points were carefully padded and pneumatic compression devices were placed on lower extremities.  After the administration of intravenous antibiotics and general  endotracheal anesthesia, the patient was repositioned in the dorsal lithotomy position using well-padded Allen stirrups.  The arms were carefully tucked at the patient's side and secured with padding.  The chest was secured in place with foam padding and cloth tape and the table was positioned in approximately 30 degree Trendelenburg.  A rectal exam showed the prostate to be 50cc, without nodulesand not fixed. The patient's abdomen, genitalia, and upper thighs were prepped and draped in the standard sterile manner.  A time was completed, verifying the correct patient, surgical procedure and positioning prior to beginning the procedure.  An 5 French urethral catheter was inserted to drain the bladder with return of about 1L of urine.  Pneumoperitoneum was introduced by placing a Veress needle into the abdomen superior to the umbilicus and insufflated with CO2 to a pressure of 15 mmHg. An 8 mm blunt tip trocar was placed just above the umbilicus.  The 0 degree camera was then passed under direct visualization.  The abdominal cavity was examined for any sign of injury, adhesions, and identification of anatomic landmarks.  The remainder of the trochars were placed which included 2 separate 8 millimeter robotic trochars which were placed 9 cm laterally and inferiorly to the initially placed camera trocar.  A 12 mm trocar was placed 8 mm lateral to the right robotic trocar.  A separate 8 mm robotic trocar was placed 8 cm lateral to the previously placed left robotic trocar on the left side.  A 5 mm trocar was placed to the right and well above the umbilicus which approximately 10 and 12 cm away from the right-sided trochars.  The robot was then docked.  I placed monopolar scissors in the right hand, a fenestrated bipolar in the left hand and a prograsp in  the fourth arm.  The urachus and median umbilical ligament was divided and we developed the space of Retzius down to the pubic bone.  I divided the parietal  peritoneum laterally up to the vas deferens on each side.  Using the prograsp forcep to provide cranial traction on the urachus, the prostate was then defatted above the prostatic vesicle junction, and the superficial dorsal venous complex was coagulated with bipolar and divided.  We submitted the periprostatic fat for pathological specimen.  The endopelvic fascia was sharply opened bilaterally and the levator muscle fibers were swept posterior laterally allowing for visualization of the deep dorsal venous complex and apex of the prostate.  The puboprostatic ligaments were sharply divided and care was taken to preserve the dorsal venous complex.  I secured the dorsal venous complex with a battery operated stapler.  I then addressed the bladder neck with a 30 degree down lens. I identified the bladder neck by pulling a Foley catheter.  The fourth arm was applying cranial traction on the urachus.  I divided the anterior bladder neck musculature until I found the anterior bladder neck mucosa which was then incised. I identified the Foley catheter within, the balloon was deflated, we pulled the Foley catheter out into the operating field.  The assistant then used a grasper to apply traction to the Foley catheter and the surgical tech then placed a [place] on the catheter near the penis for optimal retraction.  I then divided the lateral bladder neck mucosa in the posterior bladder neck mucosa.  I was well away from the bilateral ureteral orifices.  I divided the posterior bladder neck musculature until I discovered the longitudinal fibers and kept dissecting until I found the vas deferens.  The bilateral vas deferens were then freed and divided.  I then freed the bilateral seminal vesicles using blunt and sharp dissection.  I placed metal clips on the seminal vesicle vessels and avoided cautery around the seminal vesicles.  I then switched back to a 0 degree lens.  I divided the Denonvilliers fascia beneath the  prostate and developed the prostate off the rectum.  This plane was bluntly developed towards the apex of the prostate.  I then addressed the pedicles, first starting with the right and then moving towards the left. I then did a bilateral nerve spare by dividing the lateral pelvic fascia off of the prostatic capsule laterally.  I then isolated the pedicles of the prostate and placed Weck clips on the pedicles and then divided the pedicles with cold scissors.  I continued to divide the neurovascular bundles off of the prostate out to the apex of the prostate.  At this point the prostate was essentially freed up except for the urethra.   I then addressed the prostate anteriorly, dividing the dorsal vein with cautery.  The anterior urethra was then sharply divided with cold scissors.  The Foley catheter was then pulled back and we divided the posterior urethral wall. Patent venous sinuses were then oversewn with a 4-0 Vicryl suture.  The specimen was then placed in a Endo Catch bag and then the bag was placed in the upper abdomen out of the way.  I then irrigated the pelvis.  We performed a rectal test by instilling air into the rectal Foley.  The test was negative.  There is no concern for rectal injury.  I then over sewed a few bleeders alongside the pedicles with a 4-0 Vicryl suture on an RB1 needle.  I then performed  a bilateral pelvic lymph node dissection by incising the fascia overlying the right external iliac vein, dissecting distally.  I went just distal to the node of [place] were replaced clips and then divided the lymphatics.  The lateral aspect of the dissection was the pelvic sidewall, inferior was the obturator nerve and proximal of the hypogastric vessels.  I placed clips at the proximal aspect and then divided the lymphatics.  The specimen was removed with the scope grasper and sent to pathology.  Grossly, there were no enlarged lymph nodes.  This was performed on both the right and left pelvic  lymph nodes.  With good hemostasis confirmed, I then did the posterior reconstruction with a Rocco stitch.  I used a 3-0 Vloc double-armed suture on an RB1 needle.  I passed the sutures through the cut edges of Denonvilliers fascia beneath the bladder on the right side and through the posterior serrated sphincter underneath the urethra.  I ran this from right to left.  And then took the other end of the suture passing just proximal to the posterior bladder neck in the midline into the posterior serrated sphincter and around this from right to left using 3 throws and reapproximated the sutures.  I then completed the urethrovesical anastomosis using a 3-0 Vloc suture on an RB1 needle.  I passed both ends of the suture from the outside in through the bladder neck at the 6 o'clock position.  I passed both through the urethral stump from the inside out and the corresponding position.  I reapproximated the bladder neck to the urethra.  I then ran the left suture on the left side anastomosis to the 9 o'clock position.  And then went back to the right-sided suture around that up the right side of the 12 o'clock position.  I then continued the left suture to the 12 o'clock position. I identified the ureteral orifices and ensured that these were not incorporated with the sutures.  I then placed a new 18 French Foley catheter into the bladder and filled it with 10 cc of sterile water .  I then secured the knot and then passed the suture behind the pubic bone for anterior suspension.  The bladder was irrigated with 200 cc of water .  There was no leak.  Hemostasis was excellent.  A 15 French drain was placed through the previously placed fourth arm.  We did place a Carter-Thomason 0 vicryl suture through the 12 mm assistant port. The robot was undocked and all the trochars were removed under direct vision.    I enlarged the umbilical trocar site large enough to remove the prostate and freed the umbilical hernia sac. I  excised this and discarded it. I closed the fascia with 1-0 novofil sutures in interrupted figure of eights.  All the port sites were irrigated.  Exparel  was injected to the trocar sites.  The skin was closed with 4-0 Monocryl in a running subcuticular fashion.  Skin glue was applied.  At this point, the patient was extubated and awakened in the operating room and taken to recovery room in stable condition.  There were no immediate complications.  All counts were correct.  Plan: Admit for observation overnight.  Clear liquids tonight.  Regular for breakfast tomorrow.  Anticipate discharge home tomorrow.  Matt R. Stefanny Pieri MD [facility]  Pager: [phone]

## 2024-05-24 NOTE — Transfer of Care (Signed)
 Immediate Anesthesia Transfer of Care Note  Patient: Dale Singh  Procedure(s) Performed: PROSTATECTOMY, RADICAL, ROBOT-ASSISTED, LAPAROSCOPIC; UMBILICAL HERNIA REPAIR LYMPHADENECTOMY, PELVIS, ROBOT-ASSISTED (Bilateral)  Patient Location: PACU  Anesthesia Type:General  Level of Consciousness: drowsy and patient cooperative  Airway & Oxygen Therapy: Patient Spontanous Breathing and Patient connected to face mask oxygen  Post-op Assessment: Report given to RN and Post -op Vital signs reviewed and stable  Post vital signs: Reviewed and stable  Last Vitals:  Vitals Value Taken Time  BP 108/70 05/24/24 12:45  Temp 36.5 C 05/24/24 12:45  Pulse 94 05/24/24 12:47  Resp 15 05/24/24 12:47  SpO2 97 % 05/24/24 12:47  Vitals shown include unfiled device data.  Last Pain:  Vitals:   05/24/24 0554  TempSrc: Oral  PainSc: 0-No pain         Complications: No notable events documented.

## 2024-05-24 NOTE — Anesthesia Procedure Notes (Addendum)
 Procedure Name: Intubation Date/Time: 05/24/2024 7:37 AM  Performed by: Augusta Daved SAILOR, CRNAPre-anesthesia Checklist: Patient identified, Emergency Drugs available, Suction available and Patient being monitored Patient Re-evaluated:Patient Re-evaluated prior to induction Oxygen Delivery Method: Circle System Utilized Preoxygenation: Pre-oxygenation with 100% oxygen Induction Type: IV induction Ventilation: Oral airway inserted - appropriate to patient size and Two handed mask ventilation required Laryngoscope Size: Glidescope and 3 Grade View: Grade I Tube type: Oral Tube size: 7.5 mm Number of attempts: 1 Airway Equipment and Method: Stylet and Oral airway Placement Confirmation: ETT inserted through vocal cords under direct vision, positive ETCO2 and breath sounds checked- equal and bilateral Secured at: 22 (at the lip) cm Tube secured with: Tape Dental Injury: Teeth and Oropharynx as per pre-operative assessment  Comments: 2 person mask due to pt.'s facial hair. Glidescope used due to pt.'s prior ACDF. Pt. Positioned himself to comfort prior to induction. Pink square foam pillow under pt.'s head.

## 2024-05-24 NOTE — Anesthesia Postprocedure Evaluation (Signed)
 Anesthesia Post Note  Patient: Dale Singh  Procedure(s) Performed: PROSTATECTOMY, RADICAL, ROBOT-ASSISTED, LAPAROSCOPIC; UMBILICAL HERNIA REPAIR LYMPHADENECTOMY, PELVIS, ROBOT-ASSISTED (Bilateral)     Patient location during evaluation: PACU Anesthesia Type: General Level of consciousness: awake and alert Pain management: pain level controlled Vital Signs Assessment: post-procedure vital signs reviewed and stable Respiratory status: spontaneous breathing, nonlabored ventilation, respiratory function stable and patient connected to nasal cannula oxygen Cardiovascular status: blood pressure returned to baseline and stable Postop Assessment: no apparent nausea or vomiting Anesthetic complications: no   No notable events documented.  Last Vitals:  Vitals:   05/24/24 1600 05/24/24 1615  BP: 111/69 113/71  Pulse: 78 79  Resp: 10 19  Temp:  (!) 36.4 C  SpO2: 99% 96%    Last Pain:  Vitals:   05/24/24 1600  TempSrc:   PainSc: 0-No pain                 Garnette DELENA Gab

## 2024-05-24 NOTE — H&P (Signed)
 Urology Preoperative H&P   Chief Complaint: Prostate cancer  History of Present Illness: Dale Singh is a 58 y.o. male with prostate cancer here for RALP with b/l PLND.  1. Localized favorable intermediate risk prostate cancer: Patient underwent prostate biopsy on 09/26/2023 for an elevated PSA of 5.65 ng/mL. Biopsy revealed GS 3+4 = 7 in 1/12 core and GS 3+3 = 6 in 2/12 core, adenocarcinoma of the prostate with 3/12 total cores positive (40-50%), TRUS volume of 49 cm3. Denies new or worsening bone or back pain. Good appetite and stable weight. Family history: none Imaging studies: MRI prostate 07/2023 with no suspicious lesion. No extraprostatic involvement. Volume 41 cc. PMH: Prediabetes, OSA PSH: C4-C6 fusion, no abdominal surgeries TNM stage: [id number] PSA: 5.65 Gleason score: 3+4 = 7 Biopsy: 08/2023 Left: 3+3 = 6 at the left apex and left lateral apex Right: 3+4 = 7 in the right lateral apex Prostate volume: 49 cc PSAD: 0.11 Nomogram CSS (15 year): 98% PFS (5 year, 10 year): 85%, 74% EPE: 28% LNI: 3% SVI:3% IPSS: 11/35 SHIM: 22/25 He works as an personnel officer.  Past Medical History:  Diagnosis Date   Allergy    Arthritis    Asthma    Cancer (HCC)    prostate   Complication of anesthesia    slow to wake up  hard to wake up   Hypertension    no meds   Neuromuscular disorder (HCC)    Morton's neuroma left foot   Pre-diabetes    Sleep apnea    wears CPAP   Vertigo     Past Surgical History:  Procedure Laterality Date   COLONOSCOPY W/ POLYPECTOMY     NECK SURGERY  2000   ACDF  C5-7   done in [place] ?MD at [facility]    Allergies: No Known Allergies  Family History  Problem Relation Age of Onset   Diabetes Mother    Diabetes Father     Social History:  reports that he has never smoked. He has never used smokeless tobacco. He reports current alcohol use of about 5.0 standard drinks of alcohol per week. He reports that he does not use drugs.  ROS: A  complete review of systems was performed.  All systems are negative except for pertinent findings as noted.  Physical Exam:  Vital signs in last 24 hours: Temp:  [99.2 F (37.3 C)] 99.2 F (37.3 C) (11/24 0554) Pulse Rate:  [80] 80 (11/24 0554) Resp:  [14] 14 (11/24 0554) Weight:  [85.3 kg] 85.3 kg (11/24 0554) Constitutional:  Alert and oriented, No acute distress Cardiovascular: Regular rate and rhythm Respiratory: Normal respiratory effort, Lungs clear bilaterally GI: Abdomen is soft, nontender, nondistended, no abdominal masses GU: No CVA tenderness Lymphatic: No lymphadenopathy Neurologic: Grossly intact, no focal deficits Psychiatric: Normal mood and affect  Laboratory Data:  No results for input(s): WBC, HGB, HCT, PLT in the last 72 hours.  No results for input(s): NA, K, CL, GLUCOSE, BUN, CALCIUM, CREATININE in the last 72 hours.  Invalid input(s): CO3   No results found for this or any previous visit (from the past 24 hours). No results found for this or any previous visit (from the past 240 hours).  Renal Function: No results for input(s): CREATININE in the last 168 hours. Estimated Creatinine Clearance: 96.3 mL/min (by C-G formula based on SCr of 0.84 mg/dL).  Radiologic Imaging: No results found.  I independently reviewed the above imaging studies.  Assessment and Plan Dale Singh is a  58 y.o. male with prostate cancer here for RALP with b/l PLND.   Matt R. Shristi Scheib MD 05/24/2024, 7:11 AM  [facility] Pager: [phone]): [phone]

## 2024-05-25 ENCOUNTER — Encounter (HOSPITAL_COMMUNITY): Payer: Self-pay | Admitting: Urology

## 2024-05-25 DIAGNOSIS — C61 Malignant neoplasm of prostate: Secondary | ICD-10-CM | POA: Diagnosis not present

## 2024-05-25 LAB — BASIC METABOLIC PANEL WITH GFR
Anion gap: 8 (ref 5–15)
BUN: 14 mg/dL (ref 6–20)
CO2: 26 mmol/L (ref 22–32)
Calcium: 8.4 mg/dL — ABNORMAL LOW (ref 8.9–10.3)
Chloride: 104 mmol/L (ref 98–111)
Creatinine, Ser: 0.89 mg/dL (ref 0.61–1.24)
GFR, Estimated: >60 mL/min (ref >60)
Glucose, Bld: 116 mg/dL — ABNORMAL HIGH (ref 70–99)
Potassium: 4 mmol/L (ref 3.5–5.1)
Sodium: 137 mmol/L (ref 135–145)

## 2024-05-25 LAB — HEMOGLOBIN AND HEMATOCRIT, BLOOD
HCT: 35.3 % — ABNORMAL LOW (ref 39.0–52.0)
Hemoglobin: 11.9 g/dL — ABNORMAL LOW (ref 13.0–17.0)

## 2024-05-25 LAB — CREATININE, FLUID (PLEURAL, PERITONEAL, JP DRAINAGE): Creat, Fluid: 0.9 mg/dL

## 2024-05-25 MED ORDER — CHLORHEXIDINE GLUCONATE CLOTH 2 % EX PADS
6.0000 | MEDICATED_PAD | Freq: Every day | CUTANEOUS | Status: DC
  Administered 2024-05-25: 6 via TOPICAL

## 2024-05-25 MED ORDER — DOCUSATE SODIUM 100 MG PO CAPS: 100.0000 mg | ORAL_CAPSULE | Freq: Every day | ORAL | 0 refills | Status: AC | PRN

## 2024-05-25 NOTE — Discharge Summary (Signed)
 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 Briefly, Dale Singh is a 58 y.o. year old patient with a history of prostate cancer scheduled to undergo robot-assisted laparoscopic prostatectomy with bilateral lymph node dissection on 05/24/24 with Dr. Selma.   Hospital Course: Patient tolerated the procedure well.  He was then transferred to the floor after an uneventful PACU stay.  His hospital course was uncomplicated.  On POD#1 he had met discharge criteria: was eating a regular diet, was up and ambulating independently,  pain was well controlled, and was ready to for discharge. JP Cr was checked and negative, so JP drain was removed prior to discharge.  He was discharged with a foley catheter in place. He is scheduled for clinic follow-up on 12/1 for catheter removal.   Physical Exam:  BP 102/61 (BP Location: Left Arm)   Pulse 70   Temp 99.2 F (37.3 C) (Oral)   Resp 18   Ht 5' 5 (1.651 m)   Wt 85.3 kg   SpO2 94%   BMI 31.28 kg/m   General appearance: alert and no distress Head: Normocephalic, without obvious abnormality, atraumatic Lungs: NWOB  on RA Heart: regular rate and rhythm Abdomen: soft, non-tender; bowel sounds normal; no masses,  no organomegaly; incisions c/d/I  GU: Foley catheter in place draining clear yellow urine   Laboratory values:  Recent Labs    05/24/24 1313 05/25/24 0506  HGB 14.0 11.9*  HCT 41.5 35.3*   Recent Labs    05/24/24 1313 05/25/24 0506  NA 140 137  K 4.0 4.0  CL 105 104  CO2 23 26  GLUCOSE 149* 116*  BUN 17 14  CREATININE 1.04 0.89  CALCIUM 8.4* 8.4*   No results for input(s): LABPT, INR in the last 72 hours. No results for input(s): LABURIN in the last 72 hours. No results found for this or any previous visit.  Disposition: Home  Discharge instruction:   Activity:  You are encouraged to ambulate frequently (about every hour during waking hours) to help prevent blood clots from forming in your legs or lungs.  However, you should not engage in any heavy lifting (> 10-15 lbs), strenuous activity, or straining.  Diet: You should advance your diet as instructed by your physician.  It will be normal to have some bloating, nausea, and abdominal discomfort intermittently.  Prescriptions:  You will be provided a prescription for pain medication to take as needed.  If your pain is not severe enough to require the prescription pain medication, you may take extra strength Tylenol  instead which will have less side effects.  You should also take a prescribed stool softener to avoid straining with bowel movements as the prescription pain medication may constipate you.  Incisions: You may remove your dressing bandages 48 hours after  surgery if not removed in the hospital.  You will either have some small staples or special tissue glue at each of the incision sites. Once the bandages are removed (if present), the incisions may stay open to air.  You may start showering (but not soaking or bathing in water ) the 2nd day after surgery and the incisions simply need to be patted dry after the shower.   No additional care is needed.  What to call us  about: You should call the office [phone]) if you develop fever > 101 or develop persistent vomiting. Activity:  You are encouraged to ambulate frequently (about every hour during waking hours) to help prevent blood clots from forming in your legs or lungs.  However, you should not engage in any heavy lifting (> 10-15 lbs), strenuous activity, or straining.  Discharge medications:  Allergies as of 05/25/2024   No Known Allergies      Medication List     TAKE these medications    docusate sodium  100 MG capsule Commonly known as: Colace Take 1 capsule (100 mg total) by mouth daily as needed for up to 30 doses.   oxyCODONE -acetaminophen  5-325 MG tablet Commonly known as: Percocet Take 1 tablet by mouth every 4 (four) hours as needed for up to 18 doses for severe pain (pain score 7-10).   sulfamethoxazole -trimethoprim  800-160 MG tablet Commonly known as: BACTRIM  DS Take 1 tablet by mouth 2 (two) times daily for 6 doses. Take day prior, day of and day after foley removal   tamsulosin 0.4 MG Caps capsule Commonly known as: FLOMAX Take 0.4 mg by mouth at bedtime.   Vitamin D3 125 MCG (5000 UT) Tabs 5,000 IU OTC vitamin D3 daily.        Followup:   Follow-up Information     Selma Donnice SAUNDERS, MD Follow up on 05/31/2024.   Specialty: Urology Why: 8:15AM Contact information: [address] [place] [address] [phone]

## 2024-05-25 NOTE — Plan of Care (Signed)
  Problem: Education: Goal: Knowledge of General Education information will improve Description: Including pain rating scale, medication(s)/side effects and non-pharmacologic comfort measures Outcome: Completed/Met   Problem: Education: Goal: Knowledge of the procedure and recovery process will improve Outcome: Completed/Met   Problem: Bowel/Gastric: Goal: Gastrointestinal status for postoperative course will improve Outcome: Progressing   Problem: Pain Management: Goal: General experience of comfort will improve Outcome: Progressing

## 2024-05-25 NOTE — Progress Notes (Signed)
   05/25/24 0902  TOC Brief Assessment  Insurance and Status Reviewed  Patient has primary care physician Yes  Home environment has been reviewed Resides in single family home with spouse  Prior level of function: Independent with ADLs at baseline  Prior/Current Home Services No current home services  Social Drivers of Health Review SDOH reviewed no interventions necessary  Readmission risk has been reviewed Yes  Transition of care needs no transition of care needs at this time

## 2024-05-25 NOTE — Progress Notes (Signed)
 Reviewed discharge instructions with pt and wife including foley/leg bag teaching, incision care, medications and follow up appointments. Pt and wife verbalized understanding. Kensley Valladares, Lonell Louder, RN

## 2024-05-31 LAB — SURGICAL PATHOLOGY
